# Patient Record
Sex: Female | Born: 1990 | Hispanic: Yes | Marital: Single | Smoking: Never smoker
Health system: Southern US, Community
[De-identification: ages and names within clinical notes are randomized; demographics above are authoritative.]

## PROBLEM LIST (undated history)

## (undated) ENCOUNTER — Ambulatory Visit: Admission: EM | Discharge status: Absent without leave | Payer: Self-pay

## (undated) DIAGNOSIS — R569 Unspecified convulsions: Secondary | ICD-10-CM

## (undated) DIAGNOSIS — M549 Dorsalgia, unspecified: Secondary | ICD-10-CM

## (undated) DIAGNOSIS — M419 Scoliosis, unspecified: Secondary | ICD-10-CM

## (undated) DIAGNOSIS — G8929 Other chronic pain: Secondary | ICD-10-CM

---
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
## 2009-01-12 ENCOUNTER — Observation Stay: Payer: Self-pay | Admitting: Obstetrics and Gynecology

## 2009-01-27 ENCOUNTER — Encounter: Payer: Self-pay | Admitting: Maternal & Fetal Medicine

## 2009-08-20 IMAGING — US US OB FOLLOW-UP - NRPT [facility]
1 series · 14 of 28 positions shown · non-contrast
Comparison: none

[Series 1: us ob follow-up - nrpt [facility] · 14 of 54 slices shown]
[im 2/54]
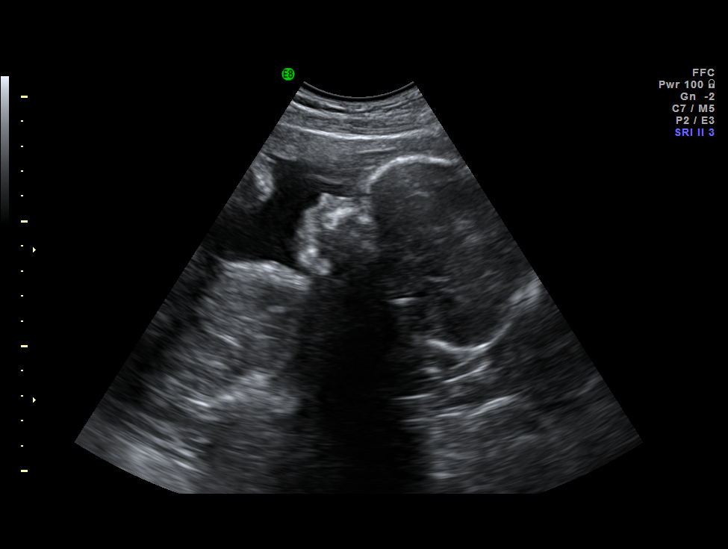
[im 6/54]
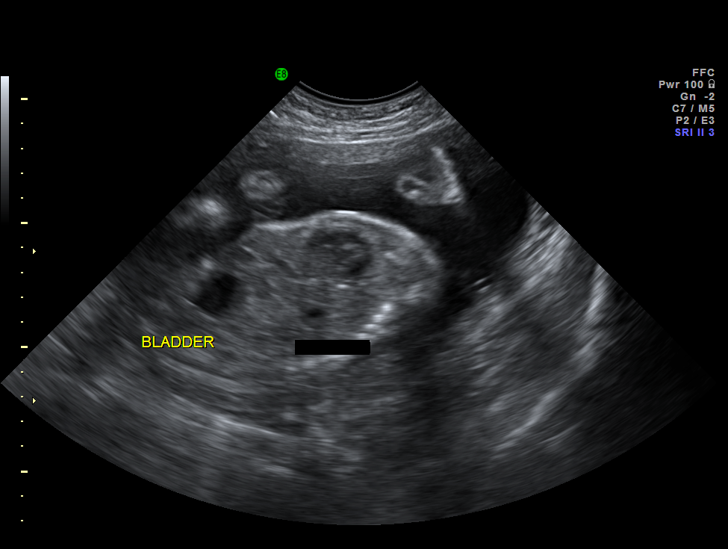
[im 10/54]
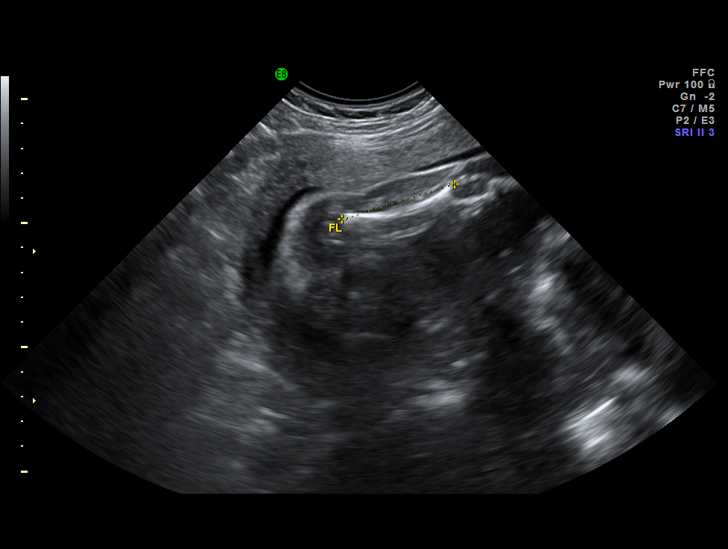
[im 14/54]
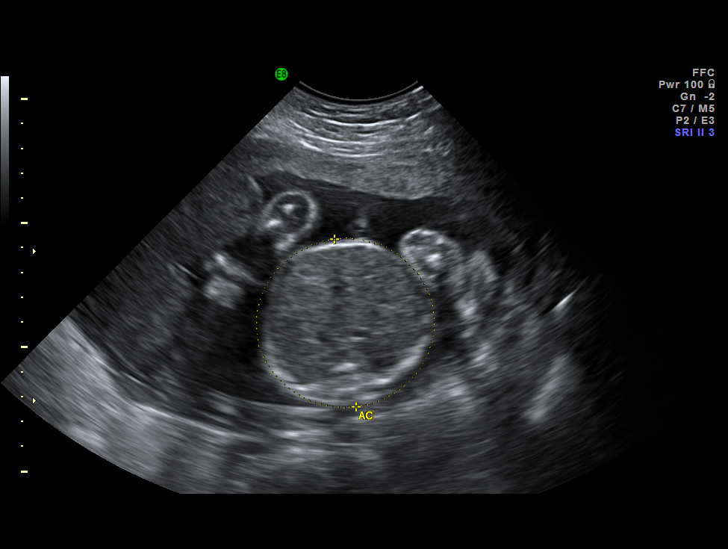
[im 18/54]
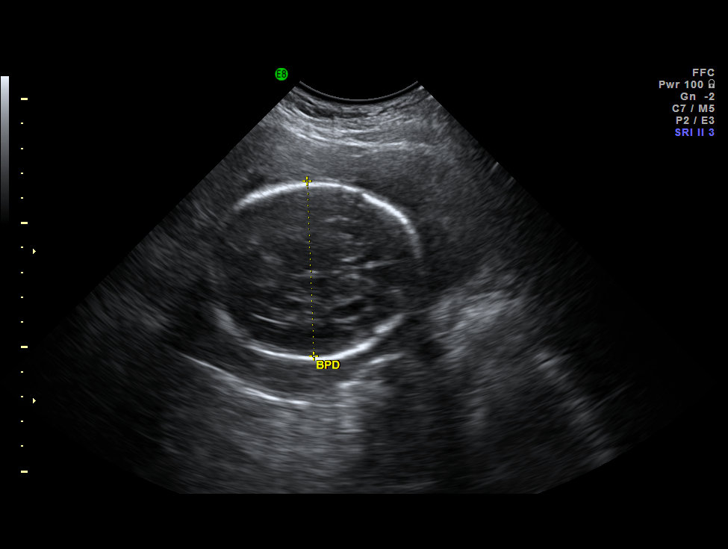
[im 22/54]
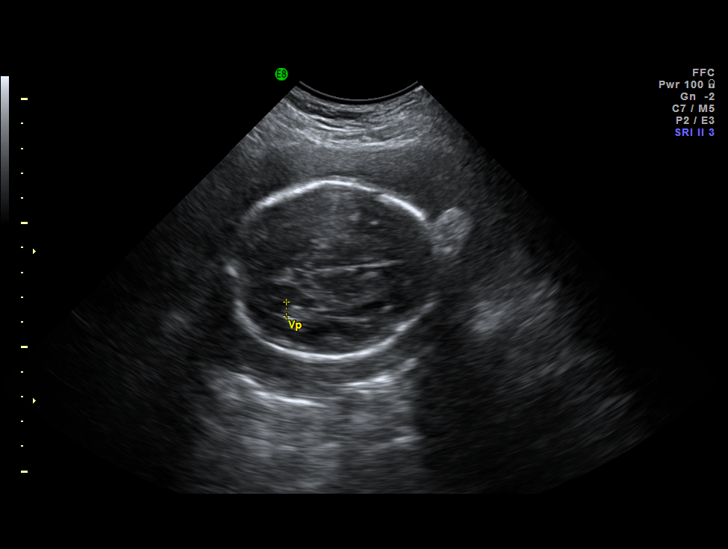
[im 26/54]
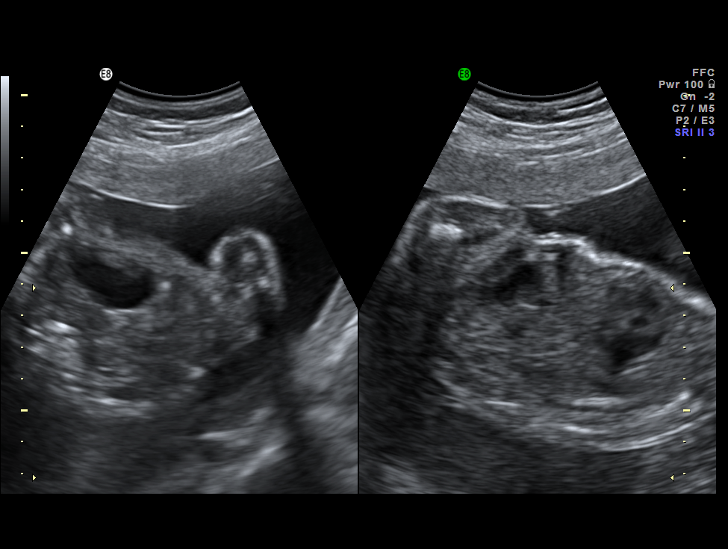
[im 30/54]
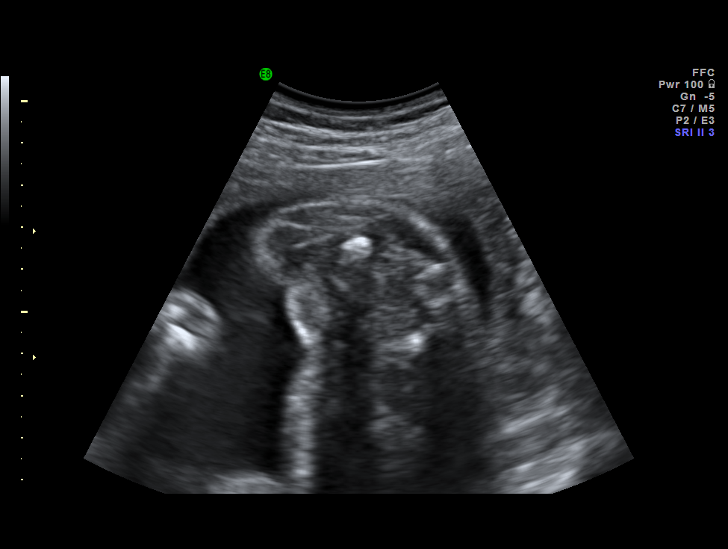
[im 34/54]
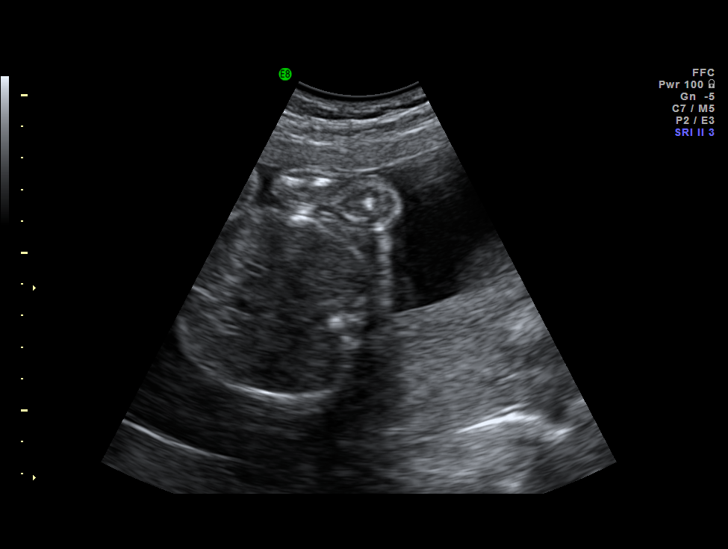
[im 38/54]
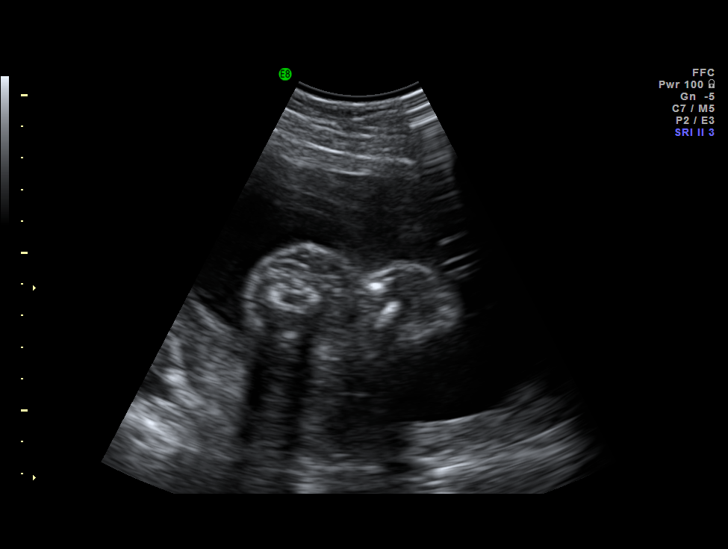
[im 42/54]
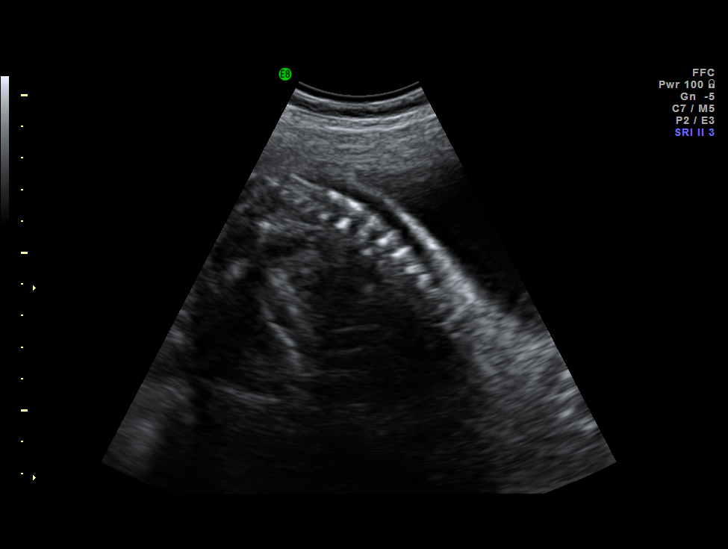
[im 46/54]
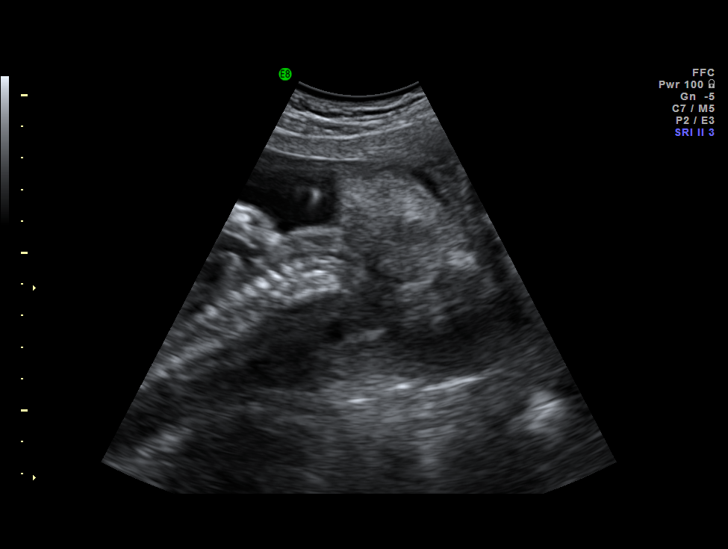
[im 50/54]
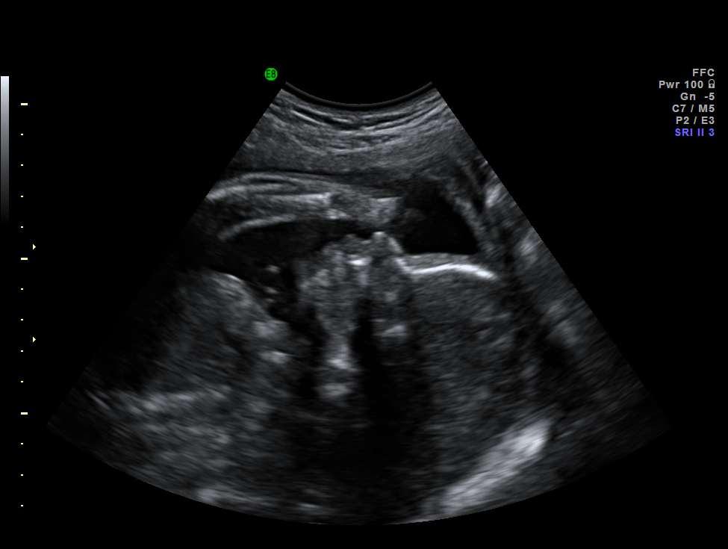
[im 54/54]
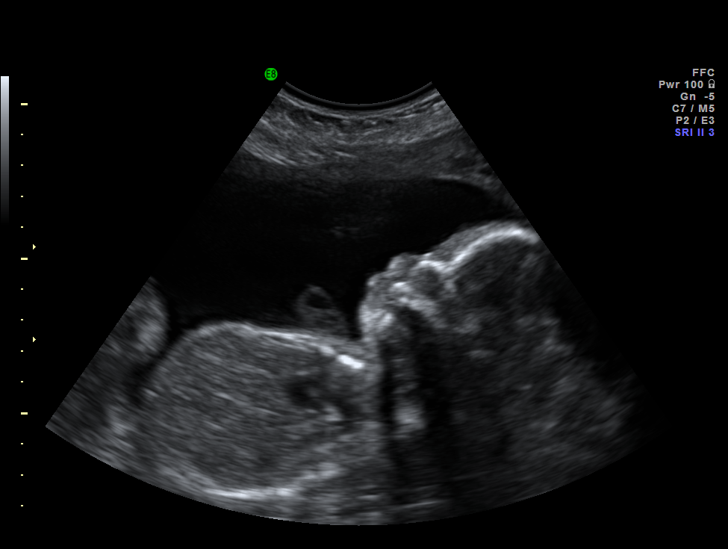

[14 of 28 positions shown; findings below may reference images not displayed]

IMAGES IMPORTED FROM THE [employer] WORKFLOW SYSTEM
NO DICTATION FOR STUDY

## 2009-10-09 ENCOUNTER — Observation Stay: Payer: Self-pay

## 2009-12-24 ENCOUNTER — Observation Stay: Payer: Self-pay | Admitting: Obstetrics and Gynecology

## 2011-02-21 ENCOUNTER — Ambulatory Visit: Payer: Self-pay | Admitting: Family Medicine

## 2014-07-23 ENCOUNTER — Emergency Department: Payer: Self-pay | Admitting: Emergency Medicine

## 2014-07-23 LAB — COMPREHENSIVE METABOLIC PANEL
ALT: 16 U/L
Albumin: 3.6 g/dL (ref 3.4–5.0)
Alkaline Phosphatase: 47 U/L
Anion Gap: 11 (ref 7–16)
BILIRUBIN TOTAL: 0.9 mg/dL (ref 0.2–1.0)
BUN: 12 mg/dL (ref 7–18)
CALCIUM: 8.4 mg/dL — AB (ref 8.5–10.1)
CO2: 24 mmol/L (ref 21–32)
Chloride: 105 mmol/L (ref 98–107)
Creatinine: 0.74 mg/dL (ref 0.60–1.30)
EGFR (African American): >60
Glucose: 96 mg/dL (ref 65–99)
OSMOLALITY: 279 (ref 275–301)
Potassium: 3.6 mmol/L (ref 3.5–5.1)
SGOT(AST): 26 U/L (ref 15–37)
SODIUM: 140 mmol/L (ref 136–145)
Total Protein: 7.4 g/dL (ref 6.4–8.2)

## 2014-07-23 LAB — DRUG SCREEN, URINE

## 2014-07-23 LAB — CBC
HCT: 39.7 % (ref 35.0–47.0)
HGB: 13.1 g/dL (ref 12.0–16.0)
MCH: 31.8 pg (ref 26.0–34.0)
MCHC: 33 g/dL (ref 32.0–36.0)
MCV: 96 fL (ref 80–100)
PLATELETS: 198 10*3/uL (ref 150–440)
RBC: 4.12 10*6/uL (ref 3.80–5.20)
RDW: 12.4 % (ref 11.5–14.5)
WBC: 6.1 10*3/uL (ref 3.6–11.0)

## 2014-07-23 LAB — ETHANOL: Ethanol: <3 mg/dL

## 2014-08-05 ENCOUNTER — Ambulatory Visit: Payer: Self-pay | Admitting: Neurology

## 2016-03-31 ENCOUNTER — Emergency Department
Admission: EM | Admit: 2016-03-31 | Discharge: 2016-03-31 | Disposition: A | Discharge status: Home / Self Care | Payer: Medicaid Other | Attending: Emergency Medicine | Admitting: Emergency Medicine

## 2016-03-31 ENCOUNTER — Encounter: Payer: Self-pay | Admitting: *Deleted

## 2016-03-31 DIAGNOSIS — R569 Unspecified convulsions: Secondary | ICD-10-CM

## 2016-03-31 DIAGNOSIS — G40909 Epilepsy, unspecified, not intractable, without status epilepticus: Secondary | ICD-10-CM | POA: Insufficient documentation

## 2016-03-31 HISTORY — DX: Unspecified convulsions: R56.9

## 2016-03-31 LAB — BASIC METABOLIC PANEL
ANION GAP: 9 (ref 5–15)
BUN: 10 mg/dL (ref 6–20)
CALCIUM: 8.6 mg/dL — AB (ref 8.9–10.3)
CO2: 24 mmol/L (ref 22–32)
CREATININE: 0.65 mg/dL (ref 0.44–1.00)
Chloride: 105 mmol/L (ref 101–111)
Glucose, Bld: 79 mg/dL (ref 65–99)
Potassium: 3.3 mmol/L — ABNORMAL LOW (ref 3.5–5.1)
SODIUM: 138 mmol/L (ref 135–145)

## 2016-03-31 LAB — POCT PREGNANCY, URINE: PREG TEST UR: NEGATIVE

## 2016-03-31 LAB — GLUCOSE, CAPILLARY: GLUCOSE-CAPILLARY: 81 mg/dL (ref 65–99)

## 2016-03-31 MED ORDER — LACOSAMIDE 50 MG PO TABS: 50.0000 mg | ORAL_TABLET | Freq: Two times a day (BID) | ORAL | 1 refills | Status: DC

## 2016-03-31 MED ORDER — LACOSAMIDE 50 MG PO TABS
50.0000 mg | ORAL_TABLET | Freq: Once | ORAL | Status: AC
  Administered 2016-03-31: 50 mg via ORAL

## 2016-03-31 NOTE — ED Provider Notes (Addendum)
[facility] Emergency Department Provider Note  ____________________________________________   I have reviewed the triage vital signs and the nursing notes.   HISTORY  Chief Complaint Seizures    HPI Sheila Hill is a 25 y.o. female who presents today complaining ofa seizure. Patient has a history of seizures. She was seen and had a negative MRI and was started on Lamictal and Keppra in 2016. However, she found herself getting depressed on these medications and took herself off. This morning she was in her normal state of health and had a grand mal witnessed seizure. Only one is reported. She has no recollection of the event. She did bite her lip. No other complaints of pain or discomfort.      Past Medical History:  Diagnosis Date  . Seizure (HCC)     There are no active problems to display for this patient.   History reviewed. No pertinent surgical history.  Prior to Admission medications   Not on File    Allergies Review of patient's allergies indicates no known allergies.  History reviewed. No pertinent family history.  Social History Social History  Substance Use Topics  . Smoking status: Never Smoker  . Smokeless tobacco: Never Used  . Alcohol use Not on file    Review of Systems Constitutional: No fever/chills Eyes: No visual changes. ENT: No sore throat. No stiff neck no neck pain Cardiovascular: Denies chest pain. Respiratory: Denies shortness of breath. Gastrointestinal:   no vomiting.  No diarrhea.  No constipation. Genitourinary: Negative for dysuria. Musculoskeletal: Negative lower extremity swelling Skin: Negative for rash. Neurological: Negative for severe headaches, focal weakness or numbness. 10-point ROS otherwise negative.  ____________________________________________   PHYSICAL EXAM:  VITAL SIGNS: ED Triage Vitals  Enc Vitals Group     BP 03/31/16 0853 119/72     Pulse Rate 03/31/16 0853 (!) 101      Resp 03/31/16 0853 18     Temp 03/31/16 0853 98.8 F (37.1 C)     Temp Source 03/31/16 0853 Oral     SpO2 03/31/16 0853 98 %     Weight 03/31/16 0859 125 lb (56.7 kg)     Height 03/31/16 0859 5\' 2"  (1.575 m)     Head Circumference --      Peak Flow --      Pain Score --      Pain Loc --      Pain Edu? --      Excl. in GC? --     Constitutional: Alert and oriented. Well appearing and in no acute distress. Eyes: Conjunctivae are normal. PERRL. EOMI. Head: Atraumatic. Nose: No congestion/rhinnorhea. Mouth/Throat: Mucous membranes are moist.  Oropharynx non-erythematous. Neck: No stridor.   Nontender with no meningismus Cardiovascular: Normal rate, regular rhythm. Grossly normal heart sounds.  Good peripheral circulation. Respiratory: Normal respiratory effort.  No retractions. Lungs CTAB. Abdominal: Soft and nontender. No distention. No guarding no rebound Back:  There is no focal tenderness or step off.  there is no midline tenderness there are no lesions noted. there is no CVA tenderness Musculoskeletal: No lower extremity tenderness, no upper extremity tenderness. No joint effusions, no DVT signs strong distal pulses no edema Neurologic:  Normal speech and language. No gross focal neurologic deficits are appreciated.  Skin:  Skin is warm, dry and intact. No rash noted. Psychiatric: Mood and affect are normal. Speech and behavior are normal.  ____________________________________________   LABS (all labs ordered are listed, but only abnormal results  are displayed)  Labs Reviewed  BASIC METABOLIC PANEL - Abnormal; Notable for the following:       Result Value   Potassium 3.3 (*)    Calcium 8.6 (*)    All other components within normal limits  GLUCOSE, CAPILLARY  CBG MONITORING, ED  POC URINE PREG, ED  POCT PREGNANCY, URINE   ____________________________________________  EKG  I personally interpreted any EKGs ordered by me or  triage  ____________________________________________  RADIOLOGY  I reviewed any imaging ordered by me or triage that were performed during my shift and, if possible, patient and/or family made aware of any abnormal findings. ____________________________________________   PROCEDURES  Procedure(s) performed: None  Procedures  Critical Care performed: None  ____________________________________________   INITIAL IMPRESSION / ASSESSMENT AND PLAN / ED COURSE  Pertinent labs & imaging results that were available during my care of the patient were reviewed by me and considered in my medical decision making (see chart for details).  Patient presents with a seizure. She was noncompliant with her medications before because they made her feel depressed. I have discussed with Dr. Thad Ranger. She suggested Vimpat 50 mg twice a day for this as a alternative. I have extensively advised the patient she cannot drive soak in the tub climb ladders etc. She does have a neurologist others she was lost to follow-up she is strongly advised to follow closely with Dr. Malvin Johns and will do so. Patient is otherwise well-appearing with no obvious evidence of a specific trigger event such as infection or pregnancy. Return precautions and follow-up given and understood.  ----------------------------------------- 1:40 PM on 03/31/2016 -----------------------------------------  We did discuss with the pharmacy who called patient cannot get Vimpat because of requirements for Medicare for prior authorization which is not possible from the emergency room on a Saturday. I will therefore start her on Keppra twice a day dosing hoping that that will be sufficient to forestall seizures but not cause any weight gain or other complaints at the patient had about it. She has been strongly advised follow closely with neurology and they may be able to either get her on Vimpat or try another regimen. Return precautions and follow-up  given and understood.   Clinical Course   ____________________________________________   FINAL CLINICAL IMPRESSION(S) / ED DIAGNOSES  Final diagnoses:  None      This chart was dictated using voice recognition software.  Despite best efforts to proofread,  errors can occur which can change meaning.      Jeanmarie Plant, MD 03/31/16 1009    Jeanmarie Plant, MD 03/31/16 1340

## 2016-03-31 NOTE — ED Notes (Signed)
Pt resting in bed, family at bedside, pt awake and alert in no acute distress 

## 2016-03-31 NOTE — ED Notes (Signed)
Pt instructed to not drive until follow up with neurology

## 2016-03-31 NOTE — ED Triage Notes (Signed)
Pt arrives via EMS from work after a witnessed grand mal seizure, pt arrives postictal, asking the same questions continually but able to state time and place, states she does not remember anything this AM, pt has bite mark to lip, states hx of seizure in the past

## 2017-03-30 ENCOUNTER — Ambulatory Visit
Admission: EM | Admit: 2017-03-30 | Discharge: 2017-03-30 | Disposition: A | Discharge status: Home / Self Care | Payer: Medicaid Other | Attending: Family Medicine | Admitting: Family Medicine

## 2017-03-30 ENCOUNTER — Encounter: Payer: Self-pay | Admitting: Gynecology

## 2017-03-30 DIAGNOSIS — Y9366 Activity, soccer: Secondary | ICD-10-CM | POA: Diagnosis not present

## 2017-03-30 DIAGNOSIS — S0012XA Contusion of left eyelid and periocular area, initial encounter: Secondary | ICD-10-CM | POA: Diagnosis not present

## 2017-03-30 DIAGNOSIS — W219XXA Striking against or struck by unspecified sports equipment, initial encounter: Secondary | ICD-10-CM | POA: Diagnosis not present

## 2017-03-30 DIAGNOSIS — H1132 Conjunctival hemorrhage, left eye: Secondary | ICD-10-CM

## 2017-03-30 HISTORY — DX: Other chronic pain: G89.29

## 2017-03-30 HISTORY — DX: Dorsalgia, unspecified: M54.9

## 2017-03-30 HISTORY — DX: Scoliosis, unspecified: M41.9

## 2017-03-30 NOTE — ED Triage Notes (Signed)
Per patient was playing soccer x 5 days ago when the soccer ball hit her in the left eye. Pt. With left eye redness and deny any pain.

## 2017-03-30 NOTE — ED Provider Notes (Signed)
[facility]    CSN: [id number] Arrival date & time: 03/30/17  0854     History   Chief Complaint Chief Complaint  Patient presents with  . Eye Problem    HPI Sheila Hill is a 26 y.o. female.   She is a 26 year old Hispanic female who was participating in a soccer activity. She states she was kicking soccer ball around with some friends when she was hit inadvertently in the left eye and left side of her face. She reports no stiff mild pain now but because the amount of bleeding in the left thigh swelling and bruising on the left side of her face she states her mother insisted she come in to be seen and evaluated. Past smoker history she has a history of seizure disorder scoliosis and some chronic back pain. Only surgeries from a C-section she's currently has Norplant as far as birth control. No known drug allergies and habits she does not smoke. No pertinent family medical history relevant to today's visit   The history is provided by the patient. No language interpreter was used.  Eye Problem  Location:  Left eye Severity:  Moderate Onset quality:  Sudden Duration:  4 days Timing:  Constant Progression:  Worsening Relieved by:  Nothing Worsened by:  Nothing Associated symptoms: redness     Past Medical History:  Diagnosis Date  . Chronic back pain   . Scoliosis   . Seizure (HCC)     There are no active problems to display for this patient.   Past Surgical History:  Procedure Laterality Date  . CESAREAN SECTION      OB History    No data available       Home Medications    Prior to Admission medications   Medication Sig Start Date End Date Taking? Authorizing Provider  divalproex (DEPAKOTE) 250 MG DR tablet Take 250 mg by mouth 3 (three) times daily.   Yes [provider]  etonogestrel (NEXPLANON) 68 MG IMPL implant 1 each by Subdermal route once.   Yes [provider]  lamoTRIgine (LAMICTAL) 150 MG tablet Take 150 mg by  mouth daily.   Yes [provider]  lacosamide (VIMPAT) 50 MG TABS tablet Take 1 tablet (50 mg total) by mouth 2 (two) times daily. 03/31/16   Jeanmarie Plant, MD    Family History No family history on file.  Social History Social History  Substance Use Topics  . Smoking status: Never Smoker  . Smokeless tobacco: Never Used  . Alcohol use No     Allergies   Patient has no known allergies.   Review of Systems Review of Systems  HENT: Positive for facial swelling.   Eyes: Positive for redness. Negative for visual disturbance.  Skin: Positive for color change.  All other systems reviewed and are negative.    Physical Exam Triage Vital Signs ED Triage Vitals  Enc Vitals Group     BP 03/30/17 0932 93/60     Pulse Rate 03/30/17 0932 78     Resp 03/30/17 0932 16     Temp 03/30/17 0932 99 F (37.2 C)     Temp Source 03/30/17 0932 Oral     SpO2 03/30/17 0932 99 %     Weight 03/30/17 0934 127 lb (57.6 kg)     Height 03/30/17 0934  (1.549 m)     Head Circumference --      Peak Flow --      Pain  Score 03/30/17 0935 0     Pain Loc --      Pain Edu? --      Excl. in GC? --    No data found.   Updated Vital Signs BP 93/60 (BP Location: Left Arm)   Pulse 78   Temp 99 F (37.2 C) (Oral)   Resp 16   Ht 5\' 1"  (1.549 m)   Wt 127 lb (57.6 kg)   LMP 03/25/2017   SpO2 99%   BMI 24.00 kg/m   Visual Acuity Right Eye Distance:   Left Eye Distance: 20/40 Bilateral Distance: 20/40 (without corrective lens)  Right Eye Near:   Left Eye Near:  L Near: 20/30 Bilateral Near:     Physical Exam  Constitutional: She is oriented to person, place, and time. She appears well-developed and well-nourished.  HENT:  Head: Normocephalic. Head is with left periorbital erythema.    Right Ear: Hearing, tympanic membrane, external ear and ear canal normal.  Left Ear: Hearing, tympanic membrane, external ear and ear canal normal.  Nose: Nose normal. Right sinus exhibits  no maxillary sinus tenderness and no frontal sinus tenderness. Left sinus exhibits no maxillary sinus tenderness and no frontal sinus tenderness.  Mouth/Throat: Uvula is midline and oropharynx is clear and moist. No uvula swelling.  Eyes: Pupils are equal, round, and reactive to light. Lids are normal. Lids are everted and swept, no foreign bodies found. Left conjunctiva has a hemorrhage. Right eye exhibits normal extraocular motion and no nystagmus. Left eye exhibits normal extraocular motion and no nystagmus. Right pupil is round and reactive. Left pupil is round and reactive. Pupils are equal.  Patient has a left subconjunctival hemorrhage there is swelling and blackened area around the left orbit as well but no signs of the subconjunctival hemorrhage going into or leaving the conjunctiva space. Good eye clear motion all around his bruising on the left orbit area consistent with a bruise that she received with soccer ball  Neck: Normal range of motion. Neck supple.  Pulmonary/Chest: Effort normal.  Musculoskeletal: Normal range of motion. She exhibits no edema or deformity.  Lymphadenopathy:    She has no cervical adenopathy.  Neurological: She is alert and oriented to person, place, and time.  Skin: Skin is warm.  Psychiatric: She has a normal mood and affect.  Vitals reviewed.    UC Treatments / Results  Labs (all labs ordered are listed, but only abnormal results are displayed) Labs Reviewed - No data to display  EKG  EKG Interpretation None       Radiology No results found.  Procedures Procedures (including critical care time)  Medications Ordered in UC Medications - No data to display   Initial Impression / Assessment and Plan / UC Course  I have reviewed the triage vital signs and the nursing notes.  Pertinent labs & imaging results that were available during my care of the patient were reviewed by me and considered in my medical decision making (see chart for  details).   patient reassured from this sports accident but no long-term problems present. Gave her note for her to return to work on Sunday since this is not a pinkeye but redness of the eye from the accident. Her visual acuity was not the best 20/40 and she is recommended follow-up with eye Dr. Gerlene Burdockichard choice for possible fitting of glasses   Final Clinical Impressions(s) / UC Diagnoses   Final diagnoses:  Subconjunctival hemorrhage of left eye  Black eye of  left side, initial encounter  Struck accidentally during sports, initial encounter    New Prescriptions Discharge Medication List as of 03/30/2017 10:14 AM      Note: This dictation was prepared with Dragon dictation along with smaller phrase technology. Any transcriptional errors that result from this process are unintentional. Controlled Substance Prescriptions [place] Controlled Substance Registry consulted? Not Applicable   Hassan Rowan, MD 03/30/17 1044

## 2017-07-12 ENCOUNTER — Emergency Department
Admission: EM | Admit: 2017-07-12 | Discharge: 2017-07-12 | Disposition: A | Discharge status: Home / Self Care | Payer: Medicaid Other | Attending: Emergency Medicine | Admitting: Emergency Medicine

## 2017-07-12 ENCOUNTER — Other Ambulatory Visit: Payer: Self-pay

## 2017-07-12 ENCOUNTER — Encounter: Payer: Self-pay | Admitting: Emergency Medicine

## 2017-07-12 DIAGNOSIS — Z79899 Other long term (current) drug therapy: Secondary | ICD-10-CM | POA: Diagnosis not present

## 2017-07-12 DIAGNOSIS — R569 Unspecified convulsions: Secondary | ICD-10-CM | POA: Insufficient documentation

## 2017-07-12 MED ORDER — DIVALPROEX SODIUM 250 MG PO DR TAB: 250.0000 mg | DELAYED_RELEASE_TABLET | Freq: Three times a day (TID) | ORAL | 0 refills | Status: DC

## 2017-07-12 MED ORDER — LAMOTRIGINE 150 MG PO TABS: 150.0000 mg | ORAL_TABLET | Freq: Two times a day (BID) | ORAL | 1 refills | Status: DC

## 2017-07-12 MED ORDER — LAMOTRIGINE 25 MG PO TABS
150.0000 mg | ORAL_TABLET | Freq: Once | ORAL | Status: AC
  Administered 2017-07-12: 150 mg via ORAL
  Filled 2017-07-12: qty 1

## 2017-07-12 NOTE — ED Notes (Signed)
Pt drowsy at this time, will wait for father to return before going over d/c instructions

## 2017-07-12 NOTE — ED Triage Notes (Signed)
Pt to  ED via [facility] from work for a grandmal seizure. Pt has hx/o same, pt is on 2 different seizure medications. Pt states that she missed a dose of her seizure medication yesterday, pt also states that she had increased caffeine consumption and less sleep, pt also under more stress than normal. Pt is A & O x 4. Pt in NAD at this time.

## 2017-07-12 NOTE — ED Provider Notes (Signed)
[facility] Emergency Department Provider Note   ____________________________________________    I have reviewed the triage vital signs and the nursing notes.   HISTORY  Chief Complaint Seizures     HPI Sheila Hill is a 26 y.o. female who presents after a reported generalized tonic-clonic seizure.  Patient reports she has a long history of seizures and takes valproic acid and lamotrigine however she recently ran out of lamotrigine and missed a dose yesterday and today which she believes is responsible for her seizure.  Her last seizure was in April, she reports she is generally well controlled and takes her seizure medication as directed.  She sees Dr. Malvin JohnsPotter of neurology.  She denies head injury or bony injury.  Feels well currently has no complaints.  Past Medical History:  Diagnosis Date  . Chronic back pain   . Scoliosis   . Seizure (HCC)     There are no active problems to display for this patient.   Past Surgical History:  Procedure Laterality Date  . CESAREAN SECTION      Prior to Admission medications   Medication Sig Start Date End Date Taking? Authorizing Provider  divalproex (DEPAKOTE) 250 MG DR tablet Take 1 tablet (250 mg total) by mouth 3 (three) times daily. 07/12/17 08/11/17  Jene EveryKinner, Korvin Valentine, MD  etonogestrel (NEXPLANON) 68 MG IMPL implant 1 each by Subdermal route once.    [provider]  lacosamide (VIMPAT) 50 MG TABS tablet Take 1 tablet (50 mg total) by mouth 2 (two) times daily. 03/31/16   Jeanmarie PlantMcShane, James A, MD  lamoTRIgine (LAMICTAL) 150 MG tablet Take 1 tablet (150 mg total) by mouth 2 (two) times daily. 07/12/17   Jene EveryKinner, Charlene Detter, MD     Allergies Patient has no known allergies.  No family history on file.  Social History Social History   Tobacco Use  . Smoking status: Never Smoker  . Smokeless tobacco: Never Used  Substance Use Topics  . Alcohol use: No  . Drug use: No    Review of  Systems  Constitutional: No fever/chills Eyes: No visual changes.  ENT: No sore throat. Cardiovascular: Denies chest pain. Respiratory: Denies shortness of breath. Gastrointestinal: No abdominal pain.  No nausea, no vomiting.   Genitourinary: Negative for dysuria. Musculoskeletal: Negative for back pain. Skin: Negative for rash. Neurological: Negative for headaches or weakness   ____________________________________________   PHYSICAL EXAM:  VITAL SIGNS: ED Triage Vitals [07/12/17 1228]  Enc Vitals Group     BP 104/74     Pulse Rate (!) 105     Resp 16     Temp 98.7 F (37.1 C)     Temp Source Oral     SpO2 97 %     Weight 54.4 kg (120 lb)     Height 1.524 m (5')     Head Circumference      Peak Flow      Pain Score      Pain Loc      Pain Edu?      Excl. in GC?     Constitutional: Alert and oriented. No acute distress. Pleasant and interactive Eyes: Conjunctivae are normal.  Head: Atraumatic. Nose: No congestion/rhinnorhea. Mouth/Throat: Mucous membranes are moist.   Neck:  Painless ROM Cardiovascular: Normal rate, regular rhythm.  Good peripheral circulation. Respiratory: Normal respiratory effort.  No retractions. Lungs CTAB. Gastrointestinal: Soft and nontender. No distention.  No CVA tenderness. Genitourinary: deferred Musculoskeletal:   Warm and well  perfused Neurologic:  Normal speech and language. No gross focal neurologic deficits are appreciated.  Skin:  Skin is warm, dry and intact. No rash noted. Psychiatric: Mood and affect are normal. Speech and behavior are normal.  ____________________________________________   LABS (all labs ordered are listed, but only abnormal results are displayed)  Labs Reviewed - No data to display ____________________________________________  EKG  None ____________________________________________  RADIOLOGY  None ____________________________________________   PROCEDURES  Procedure(s) performed:  No  Procedures   Critical Care performed: No ____________________________________________   INITIAL IMPRESSION / ASSESSMENT AND PLAN / ED COURSE  Pertinent labs & imaging results that were available during my care of the patient were reviewed by me and considered in my medical decision making (see chart for details).  Patient with a history of epilepsy, generally well controlled.  Did miss 2 doses of medication.  We will give her lamotrigine here that she missed and monitor for any other seizure activity.  Patient observed in the department for several hours, she remains well-appearing in no distress.  Will discharge with prescriptions for her seizure medication    ____________________________________________   FINAL CLINICAL IMPRESSION(S) / ED DIAGNOSES  Final diagnoses:  Seizure (HCC)        Note:  This document was prepared using Dragon voice recognition software and may include unintentional dictation errors.    Jene EveryKinner, Kentley Blyden, MD 07/12/17 1524

## 2017-09-10 ENCOUNTER — Emergency Department
Admission: EM | Admit: 2017-09-10 | Discharge: 2017-09-11 | Disposition: A | Discharge status: Other Acute Inpatient Hospital | Payer: Medicaid Other | Attending: Emergency Medicine | Admitting: Emergency Medicine

## 2017-09-10 ENCOUNTER — Other Ambulatory Visit: Payer: Self-pay

## 2017-09-10 DIAGNOSIS — F322 Major depressive disorder, single episode, severe without psychotic features: Secondary | ICD-10-CM

## 2017-09-10 DIAGNOSIS — T50902A Poisoning by unspecified drugs, medicaments and biological substances, intentional self-harm, initial encounter: Secondary | ICD-10-CM

## 2017-09-10 DIAGNOSIS — Z046 Encounter for general psychiatric examination, requested by authority: Secondary | ICD-10-CM | POA: Insufficient documentation

## 2017-09-10 DIAGNOSIS — Y939 Activity, unspecified: Secondary | ICD-10-CM | POA: Insufficient documentation

## 2017-09-10 DIAGNOSIS — T1491XA Suicide attempt, initial encounter: Secondary | ICD-10-CM | POA: Insufficient documentation

## 2017-09-10 DIAGNOSIS — Y929 Unspecified place or not applicable: Secondary | ICD-10-CM | POA: Insufficient documentation

## 2017-09-10 DIAGNOSIS — Z79899 Other long term (current) drug therapy: Secondary | ICD-10-CM | POA: Insufficient documentation

## 2017-09-10 DIAGNOSIS — X838XXA Intentional self-harm by other specified means, initial encounter: Secondary | ICD-10-CM | POA: Diagnosis not present

## 2017-09-10 DIAGNOSIS — T39392A Poisoning by other nonsteroidal anti-inflammatory drugs [NSAID], intentional self-harm, initial encounter: Secondary | ICD-10-CM | POA: Insufficient documentation

## 2017-09-10 DIAGNOSIS — Y999 Unspecified external cause status: Secondary | ICD-10-CM | POA: Insufficient documentation

## 2017-09-10 DIAGNOSIS — G40909 Epilepsy, unspecified, not intractable, without status epilepticus: Secondary | ICD-10-CM

## 2017-09-10 DIAGNOSIS — R45851 Suicidal ideations: Secondary | ICD-10-CM | POA: Insufficient documentation

## 2017-09-11 ENCOUNTER — Other Ambulatory Visit: Payer: Self-pay

## 2017-09-11 ENCOUNTER — Inpatient Hospital Stay
Admission: AD | Admit: 2017-09-11 | Discharge: 2017-09-13 | DRG: 885 | Disposition: A | Discharge status: Home / Self Care | Payer: Medicaid Other | Attending: Psychiatry | Admitting: Psychiatry

## 2017-09-11 DIAGNOSIS — Z79891 Long term (current) use of opiate analgesic: Secondary | ICD-10-CM

## 2017-09-11 DIAGNOSIS — Z793 Long term (current) use of hormonal contraceptives: Secondary | ICD-10-CM | POA: Diagnosis not present

## 2017-09-11 DIAGNOSIS — T1491XA Suicide attempt, initial encounter: Secondary | ICD-10-CM | POA: Diagnosis present

## 2017-09-11 DIAGNOSIS — F322 Major depressive disorder, single episode, severe without psychotic features: Secondary | ICD-10-CM

## 2017-09-11 DIAGNOSIS — M419 Scoliosis, unspecified: Secondary | ICD-10-CM | POA: Diagnosis present

## 2017-09-11 DIAGNOSIS — G40909 Epilepsy, unspecified, not intractable, without status epilepticus: Secondary | ICD-10-CM

## 2017-09-11 DIAGNOSIS — Z79899 Other long term (current) drug therapy: Secondary | ICD-10-CM | POA: Diagnosis not present

## 2017-09-11 DIAGNOSIS — G8929 Other chronic pain: Secondary | ICD-10-CM | POA: Diagnosis present

## 2017-09-11 DIAGNOSIS — Z915 Personal history of self-harm: Secondary | ICD-10-CM

## 2017-09-11 DIAGNOSIS — T39392A Poisoning by other nonsteroidal anti-inflammatory drugs [NSAID], intentional self-harm, initial encounter: Secondary | ICD-10-CM | POA: Diagnosis not present

## 2017-09-11 DIAGNOSIS — M549 Dorsalgia, unspecified: Secondary | ICD-10-CM | POA: Diagnosis present

## 2017-09-11 DIAGNOSIS — F1010 Alcohol abuse, uncomplicated: Secondary | ICD-10-CM | POA: Diagnosis present

## 2017-09-11 LAB — CBC
HCT: 38.6 % (ref 35.0–47.0)
HEMOGLOBIN: 13.3 g/dL (ref 12.0–16.0)
MCH: 33.9 pg (ref 26.0–34.0)
MCHC: 34.5 g/dL (ref 32.0–36.0)
MCV: 98.3 fL (ref 80.0–100.0)
PLATELETS: 180 10*3/uL (ref 150–440)
RBC: 3.93 MIL/uL (ref 3.80–5.20)
RDW: 12.8 % (ref 11.5–14.5)
WBC: 6.7 10*3/uL (ref 3.6–11.0)

## 2017-09-11 LAB — URINE DRUG SCREEN, QUALITATIVE (ARMC ONLY)
AMPHETAMINES, UR SCREEN: NOT DETECTED
Barbiturates, Ur Screen: NOT DETECTED
Benzodiazepine, Ur Scrn: NOT DETECTED
COCAINE METABOLITE, UR ~~LOC~~: NOT DETECTED
Cannabinoid 50 Ng, Ur ~~LOC~~: NOT DETECTED
MDMA (ECSTASY) UR SCREEN: NOT DETECTED
METHADONE SCREEN, URINE: NOT DETECTED
Opiate, Ur Screen: NOT DETECTED
Phencyclidine (PCP) Ur S: NOT DETECTED
TRICYCLIC, UR SCREEN: NOT DETECTED

## 2017-09-11 LAB — COMPREHENSIVE METABOLIC PANEL
ALBUMIN: 3.9 g/dL (ref 3.5–5.0)
ALK PHOS: 42 U/L (ref 38–126)
ALT: 10 U/L — ABNORMAL LOW (ref 14–54)
ANION GAP: 8 (ref 5–15)
AST: 20 U/L (ref 15–41)
BILIRUBIN TOTAL: 0.5 mg/dL (ref 0.3–1.2)
BUN: 12 mg/dL (ref 6–20)
CALCIUM: 8.5 mg/dL — AB (ref 8.9–10.3)
CO2: 28 mmol/L (ref 22–32)
Chloride: 104 mmol/L (ref 101–111)
Creatinine, Ser: 0.85 mg/dL (ref 0.44–1.00)
GFR calc non Af Amer: >60 mL/min (ref >60)
GLUCOSE: 89 mg/dL (ref 65–99)
Potassium: 3.7 mmol/L (ref 3.5–5.1)
SODIUM: 140 mmol/L (ref 135–145)
TOTAL PROTEIN: 7.5 g/dL (ref 6.5–8.1)

## 2017-09-11 LAB — SALICYLATE LEVEL: Salicylate Lvl: <7 mg/dL (ref 2.8–30.0)

## 2017-09-11 LAB — ACETAMINOPHEN LEVEL: Acetaminophen (Tylenol), Serum: <10 ug/mL — ABNORMAL LOW (ref 10–30)

## 2017-09-11 LAB — ETHANOL: Alcohol, Ethyl (B): <10 mg/dL (ref <10)

## 2017-09-11 MED ORDER — ACETAMINOPHEN 325 MG PO TABS: 650.0000 mg | ORAL_TABLET | Freq: Four times a day (QID) | ORAL | Status: DC | PRN

## 2017-09-11 MED ORDER — LAMOTRIGINE 100 MG PO TABS
150.0000 mg | ORAL_TABLET | Freq: Two times a day (BID) | ORAL | Status: DC
  Administered 2017-09-11 (×2): 150 mg via ORAL
  Filled 2017-09-11 (×2): qty 1
  Filled 2017-09-11: qty 2

## 2017-09-11 MED ORDER — DIVALPROEX SODIUM 250 MG PO DR TAB
250.0000 mg | DELAYED_RELEASE_TABLET | Freq: Three times a day (TID) | ORAL | Status: DC
  Administered 2017-09-12 (×2): 250 mg via ORAL
  Filled 2017-09-11 (×2): qty 1

## 2017-09-11 MED ORDER — TRAZODONE HCL 100 MG PO TABS
100.0000 mg | ORAL_TABLET | Freq: Every evening | ORAL | Status: DC | PRN
  Administered 2017-09-12: 100 mg via ORAL
  Filled 2017-09-11: qty 1

## 2017-09-11 MED ORDER — ALUM & MAG HYDROXIDE-SIMETH 200-200-20 MG/5ML PO SUSP: 30.0000 mL | ORAL | Status: DC | PRN

## 2017-09-11 MED ORDER — DIVALPROEX SODIUM 250 MG PO DR TAB
250.0000 mg | DELAYED_RELEASE_TABLET | Freq: Three times a day (TID) | ORAL | Status: DC
  Administered 2017-09-11 (×3): 250 mg via ORAL
  Filled 2017-09-11 (×3): qty 1

## 2017-09-11 MED ORDER — MIRTAZAPINE 15 MG PO TABS
15.0000 mg | ORAL_TABLET | Freq: Every day | ORAL | Status: DC
  Administered 2017-09-12: 15 mg via ORAL
  Filled 2017-09-11: qty 1

## 2017-09-11 MED ORDER — MAGNESIUM HYDROXIDE 400 MG/5ML PO SUSP: 30.0000 mL | Freq: Every day | ORAL | Status: DC | PRN

## 2017-09-11 MED ORDER — MIRTAZAPINE 15 MG PO TABS
15.0000 mg | ORAL_TABLET | Freq: Every day | ORAL | Status: DC
  Administered 2017-09-11: 15 mg via ORAL
  Filled 2017-09-11: qty 1

## 2017-09-11 MED ORDER — CHARCOAL ACTIVATED PO LIQD
50.0000 g | Freq: Once | ORAL | Status: AC
  Administered 2017-09-11: 50 g via ORAL

## 2017-09-11 MED ORDER — LAMOTRIGINE 25 MG PO TABS
150.0000 mg | ORAL_TABLET | Freq: Two times a day (BID) | ORAL | Status: DC
  Administered 2017-09-12 – 2017-09-13 (×3): 150 mg via ORAL
  Filled 2017-09-11 (×4): qty 1

## 2017-09-11 MED ORDER — SODIUM CHLORIDE 0.9 % IV SOLN
Freq: Once | INTRAVENOUS | Status: AC
  Administered 2017-09-11: 05:00:00 via INTRAVENOUS

## 2017-09-11 MED ORDER — HYDROXYZINE HCL 25 MG PO TABS: 25.0000 mg | ORAL_TABLET | Freq: Three times a day (TID) | ORAL | Status: DC | PRN

## 2017-09-11 NOTE — BH Assessment (Signed)
Assessment Note  Sheila Hill is an 27 y.o. female who presents to the ED for an apparent medication overdose in an attempt to end her life. Per Dr. Manson Passey "Patient states that she took a handful of Toradol 10 mg tablets in an unknown time.  Based on calculation patient may have taken 13 tablets.  Patient states she did so to "kill herself".  Patient denies any previous suicide attempts." During the assessment, the patient was not forthcoming about why she took the medication. She stated, "I'm just tired of everything. I've had a lot of stress for a long time and just felt like I couldn't handle anymore." She did admit however, that she was trying to hurt herself by taking the pills. She states that she has been feeling somewhat depressed lately with feelings of self-pity and "not being where I should be in life".   Pt reports that she has three children and lives at home with her parents. Pt does not mention having a boyfriend or a breakup as the trigger for her suicide attempt. She denies any drug and/or alcohol use. Pt denies being currently suicidal, homicidal, or having any A/V H.  Diagnosis: Suicidal Ideation   Past Medical History:  Past Medical History:  Diagnosis Date  . Chronic back pain   . Scoliosis   . Seizure [facility])     Past Surgical History:  Procedure Laterality Date  . CESAREAN SECTION      Family History: No family history on file.  Social History:  reports that  has never smoked. she has never used smokeless tobacco. She reports that she does not drink alcohol or use drugs.  Additional Social History:  Alcohol / Drug Use Pain Medications: See MAR Prescriptions: See MAR Over the Counter: See MAR History of alcohol / drug use?: No history of alcohol / drug abuse(Pt denies)  CIWA: CIWA-Ar BP: (!) 87/42 Pulse Rate: 83 COWS:    Allergies: No Known Allergies  Home Medications:  (Not in a hospital admission)  OB/GYN Status:  No LMP recorded. Patient has had an  implant.  General Assessment Data Location of Assessment: [facility] ED TTS Assessment: In system Is this a Tele or Face-to-Face Assessment?: Face-to-Face Is this an Initial Assessment or a Re-assessment for this encounter?: Initial Assessment Marital status: Single Maiden name: n/a Is patient pregnant?: No Pregnancy Status: No Living Arrangements: Parent Can pt return to current living arrangement?: Yes Admission Status: Involuntary Is patient capable of signing voluntary admission?: No Referral Source: Self/Family/Friend Insurance type: Medicaid  Medical Screening Exam [facility] Walk-in ONLY) Medical Exam completed: Yes  Crisis Care Plan Living Arrangements: Parent Legal Guardian: Other:(Self) Name of Psychiatrist: none reported Name of Therapist: none reported  Education Status Is patient currently in school?: No Highest grade of school patient has completed: Some College  Risk to self with the past 6 months Suicidal Ideation: No-Not Currently/Within Last 6 Months Has patient been a risk to self within the past 6 months prior to admission? : Yes Suicidal Intent: No-Not Currently/Within Last 6 Months Has patient had any suicidal intent within the past 6 months prior to admission? : Yes Is patient at risk for suicide?: Yes Suicidal Plan?: Yes-Currently Present Has patient had any suicidal plan within the past 6 months prior to admission? : Yes Specify Current Suicidal Plan: overdose by medication Access to Means: Yes Specify Access to Suicidal Means: home medications What has been your use of drugs/alcohol within the last 12 months?: pt denies Previous Attempts/Gestures:  No How many times?: 0 Other Self Harm Risks: pt denies Triggers for Past Attempts: None known Intentional Self Injurious Behavior: None Family Suicide History: Unknown Recent stressful life event(s): Loss (Comment)(relationship breakup) Persecutory voices/beliefs?: No Depression: Yes Depression Symptoms:  Feeling worthless/self pity Substance abuse history and/or treatment for substance abuse?: No Suicide prevention information given to non-admitted patients: Not applicable  Risk to Others within the past 6 months Homicidal Ideation: No Does patient have any lifetime risk of violence toward others beyond the six months prior to admission? : No Thoughts of Harm to Others: No Current Homicidal Intent: No Current Homicidal Plan: No Access to Homicidal Means: No Identified Victim: none noted History of harm to others?: No Assessment of Violence: None Noted Violent Behavior Description: pt denies violent behaviors Does patient have access to weapons?: No Criminal Charges Pending?: No Does patient have a court date: No Is patient on probation?: No  Psychosis Hallucinations: None noted Delusions: None noted  Mental Status Report Appearance/Hygiene: In scrubs Eye Contact: Good Motor Activity: Freedom of movement Speech: Soft, Slow, Logical/coherent Level of Consciousness: Drowsy Mood: Sad Affect: Sad Anxiety Level: None Thought Processes: Coherent Judgement: Partial Orientation: Person, Place, Time Obsessive Compulsive Thoughts/Behaviors: None  Cognitive Functioning Concentration: Normal Memory: Recent Intact, Remote Intact IQ: Average Insight: Poor Impulse Control: Poor Appetite: Poor Weight Loss: 5 Weight Gain: 0 Sleep: Decreased Total Hours of Sleep: 6 Vegetative Symptoms: None     Prior Inpatient Therapy Prior Inpatient Therapy: No Prior Therapy Dates: n/a Prior Therapy Facilty/Provider(s): n/a Reason for Treatment: n/a  Prior Outpatient Therapy Prior Outpatient Therapy: No Prior Therapy Dates: n/a Prior Therapy Facilty/Provider(s): n/a Reason for Treatment: n/a Does patient have an ACCT team?: No Does patient have Intensive In-House Services?  : No Does patient have [facility] services? : No Does patient have P4CC services?: No  ADL Screening (condition  at time of admission) Is the patient deaf or have difficulty hearing?: No Does the patient have difficulty seeing, even when wearing glasses/contacts?: No Does the patient have difficulty concentrating, remembering, or making decisions?: No Does the patient have difficulty dressing or bathing?: No Does the patient have difficulty walking or climbing stairs?: No Weakness of Legs: None Weakness of Arms/Hands: None  Home Assistive Devices/Equipment Home Assistive Devices/Equipment: None  Therapy Consults (therapy consults require a physician order) PT Evaluation Needed: No OT Evalulation Needed: No SLP Evaluation Needed: No Abuse/Neglect Assessment (Assessment to be complete while patient is alone) Abuse/Neglect Assessment Can Be Completed: Yes Physical Abuse: Denies Verbal Abuse: Denies Sexual Abuse: Denies Exploitation of patient/patient's resources: Denies Self-Neglect: Denies Values / Beliefs Cultural Requests During Hospitalization: None Spiritual Requests During Hospitalization: None Consults Spiritual Care Consult Needed: No Social Work Consult Needed: No Merchant navy officerAdvance Directives (For Healthcare) Does Patient Have a Medical Advance Directive?: No Would patient like information on creating a medical advance directive?: No - Patient declined    Additional Information 1:1 In Past 12 Months?: No CIRT Risk: No Elopement Risk: No  Child/Adolescent Assessment Running Away Risk: Denies Bed-Wetting: Denies Destruction of Property: Denies Cruelty to Animals: Denies Stealing: Denies Rebellious/Defies Authority: Denies Satanic Involvement: Denies Archivistire Setting: Denies Problems at Progress EnergySchool: Denies Gang Involvement: Denies  Disposition:  Disposition Initial Assessment Completed for this Encounter: Yes Disposition of Patient: Pending Review with psychiatrist  On Site Evaluation by:   Reviewed with Physician:    Coulter Oldaker D Keiland Pickering 09/11/2017 5:52 AM

## 2017-09-11 NOTE — ED Notes (Signed)
BEHAVIORAL HEALTH ROUNDING Patient sleeping: No. Patient alert and oriented: yes Behavior appropriate: Yes.  ; If no, describe:  Nutrition and fluids offered: yes Toileting and hygiene offered: Yes  Sitter present: q15 minute observations and security  monitoring Law enforcement present: Yes  ODS  

## 2017-09-11 NOTE — ED Provider Notes (Signed)
Vitals:   09/11/17 1000 09/11/17 1100  BP: 98/66 102/68  Pulse: 79 87  Resp: (!) 21 14  Temp:    SpO2: 99% 100%   I accepted care this morning at shift exchange.  No acute events reported to me overnight by nursing report or physician report.  Patient under IVC, medically cleared.  Plan for psychiatric disposition/admission.    Sheila Hill, Sheila Kocsis, MD 09/11/17 [phone]

## 2017-09-11 NOTE — ED Notes (Signed)
Explained to pt the process of IVC and overdose monitoring. Pt verbalizes understanding. Pt denies feelings of wanting to die at this time.

## 2017-09-11 NOTE — ED Notes (Signed)
Pt states she is not actively suicidal. Pt states " I just feel heavy and relaxed" Pt dresses out by Gregor Hamsebecca RN and Educational psychologistLorrie RN. Pt placed in burgundy scrubs, jewelry removed and placed in a specimen cup. Clothing and jewelry placed in a pt belonging bag with name label placed on bag.

## 2017-09-11 NOTE — ED Provider Notes (Signed)
[facility] Emergency Department Provider Note    First MD Initiated Contact with Patient 09/10/17 2353     (approximate)  I have reviewed the triage vital signs and the nursing notes.   HISTORY  Chief Complaint Suicidal and Ingestion    HPI Sheila Hill is a 27 y.o. female presents to the emergency department status post intentional medication overdose suicide attempt.  Patient states that she took a handful of Toradol 10 mg tablets in an unknown time.  Based on calculation patient may have taken 13 tablets.  Patient states she did so to "kill herself".  Patient denies any previous suicide attempts.      Past Medical History:  Diagnosis Date  . Chronic back pain   . Scoliosis   . Seizure (HCC)     There are no active problems to display for this patient.   Past Surgical History:  Procedure Laterality Date  . CESAREAN SECTION      Prior to Admission medications   Medication Sig Start Date End Date Taking? Authorizing Provider  divalproex (DEPAKOTE) 250 MG DR tablet Take 1 tablet (250 mg total) by mouth 3 (three) times daily. 07/12/17 08/11/17  Jene EveryKinner, Robert, MD  etonogestrel (NEXPLANON) 68 MG IMPL implant 1 each by Subdermal route once.    [provider]  lacosamide (VIMPAT) 50 MG TABS tablet Take 1 tablet (50 mg total) by mouth 2 (two) times daily. 03/31/16   Jeanmarie PlantMcShane, James A, MD  lamoTRIgine (LAMICTAL) 150 MG tablet Take 1 tablet (150 mg total) by mouth 2 (two) times daily. 07/12/17   Jene EveryKinner, Robert, MD    Allergies No known drug allergies No family history on file.  Social History Social History   Tobacco Use  . Smoking status: Never Smoker  . Smokeless tobacco: Never Used  Substance Use Topics  . Alcohol use: No  . Drug use: No    Review of Systems Constitutional: No fever/chills Eyes: No visual changes. ENT: No sore throat. Cardiovascular: Denies chest pain. Respiratory: Denies shortness of  breath. Gastrointestinal: No abdominal pain.  No nausea, no vomiting.  No diarrhea.  No constipation. Genitourinary: Negative for dysuria. Musculoskeletal: Negative for neck pain.  Negative for back pain. Integumentary: Negative for rash. Neurological: Negative for headaches, focal weakness or numbness. Psychiatry: Positive for medication overdose suicide attempt  ____________________________________________   PHYSICAL EXAM:  VITAL SIGNS: ED Triage Vitals [09/10/17 2356]  Enc Vitals Group     BP 116/63     Pulse Rate (!) 107     Resp 17     Temp 98.2 F (36.8 C)     Temp Source Oral     SpO2 100 %     Weight      Height      Head Circumference      Peak Flow      Pain Score      Pain Loc      Pain Edu?      Excl. in GC?     Constitutional: Alert and oriented.  Tearful  eyes: Conjunctivae are normal. PERRL. EOMI. Head: Atraumatic. Mouth/Throat: Mucous membranes are moist. Oropharynx non-erythematous. Neck: No stridor.   Cardiovascular: Normal rate, regular rhythm. Good peripheral circulation. Grossly normal heart sounds. Respiratory: Normal respiratory effort.  No retractions. Lungs CTAB. Gastrointestinal: Soft and nontender. No distention.  Musculoskeletal: No lower extremity tenderness nor edema. No gross deformities of extremities. Neurologic:  Normal speech and language. No gross focal neurologic deficits are  appreciated.  Skin:  Skin is warm, dry and intact. No rash noted. Psychiatric: Mood and affect are normal. Speech and behavior are normal.  ____________________________________________   LABS (all labs ordered are listed, but only abnormal results are displayed)  Labs Reviewed  COMPREHENSIVE METABOLIC PANEL  ETHANOL  CBC  URINE DRUG SCREEN, QUALITATIVE ([facility] ONLY)  POC URINE PREG, ED   ____________________________________________  EKG  ED ECG REPORT I, [place] BROWN, the attending physician, personally viewed and interpreted this ECG.    Date: 09/11/2017  EKG Time: 11:58 PM  Rate: 106  Rhythm: Sinus tachycardia  Axis: Normal  Intervals: Normal  ST&T Change: None    Procedures   ____________________________________________   INITIAL IMPRESSION / ASSESSMENT AND PLAN / ED COURSE  As part of my medical decision making, I reviewed the following data within the electronic MEDICAL RECORD 44 year old female presenting with above-stated history and physical exam secondary to intentional medication overdose suicide attempt patient involuntarily committed by law enforcement.  Laboratory data unremarkable including repeat 4-hour Tylenol level.  Patient medically cleared at this time.  Await psychiatry consultation ____________________________________________  FINAL CLINICAL IMPRESSION(S) / ED DIAGNOSES  Final diagnoses:  Medication overdose, intentional self-harm, initial encounter (HCC)  Suicide attempt (HCC)     MEDICATIONS GIVEN DURING THIS VISIT:  Medications  charcoal activated (NO SORBITOL) (ACTIDOSE-AQUA) suspension 50 g (50 g Oral Given 09/11/17 0006)     ED Discharge Orders    None       Note:  This document was prepared using Dragon voice recognition software and may include unintentional dictation errors.    Darci Current, MD 09/11/17 [phone]

## 2017-09-11 NOTE — ED Notes (Signed)
Pt tearful when assessed by RN. Pt stated this unit is giving her anxiety. Currently denies SI. Maintained on 15 minute checks and observation by security camera for safety.

## 2017-09-11 NOTE — ED Triage Notes (Addendum)
Patient came in EMS for intentional overdose of 13 10mg  tablets of Ketoralac because boyfriend broke up with her. She texted her friend and said she wanted to end it all. Hx of seizures. PD taking out papers for IVC.

## 2017-09-11 NOTE — ED Notes (Signed)
BEHAVIORAL HEALTH ROUNDING Patient sleeping: Yes.   Patient alert and oriented: eyes closed  Appears to be asleep Behavior appropriate: Yes.  ; If no, describe:  Nutrition and fluids offered: Yes  Toileting and hygiene offered: sleeping Sitter present: q 15 minute observations and security monitoring Law enforcement present: yes  ODS 

## 2017-09-11 NOTE — ED Provider Notes (Signed)
Discussed with Dr. Toni Amendlapacs face-to-face, plan for [facility].   Governor RooksLord, Alima Naser, MD 09/11/17 [phone]

## 2017-09-11 NOTE — ED Notes (Signed)
Report given to RN in BMU. [facility]. Patient in agreement with plan of care.

## 2017-09-11 NOTE — ED Notes (Signed)
Patient is resting comfortably. 

## 2017-09-11 NOTE — ED Notes (Signed)
[facility] Verbal report given to receiving nurse Amy B, RN. Pt A & O X3 at this time. Ambulatory with a steady gait. Appears to be in no physical distress at time of transfer.

## 2017-09-11 NOTE — Plan of Care (Signed)
  Not Progressing Pain Managment: General experience of comfort will improve 09/11/2017 2349 - Not Progressing by Galen ManilaVigil, Treyshaun Keatts E, RN Education: Utilization of techniques to improve thought processes will improve 09/11/2017 2349 - Not Progressing by Galen ManilaVigil, Eleah Lahaie E, RN Knowledge of the prescribed therapeutic regimen will improve 09/11/2017 2349 - Not Progressing by Galen ManilaVigil, Marke Goodwyn E, RN Coping: Ability to cope will improve 09/11/2017 2349 - Not Progressing by Galen ManilaVigil, Cailynn Bodnar E, RN Ability to verbalize feelings will improve 09/11/2017 2349 - Not Progressing by Galen ManilaVigil, Hanin Decook E, RN Health Behavior/Discharge Planning: Ability to make decisions will improve 09/11/2017 2349 - Not Progressing by Galen ManilaVigil, Momina Hunton E, RN Compliance with therapeutic regimen will improve 09/11/2017 2349 - Not Progressing by Galen ManilaVigil, Aurea Aronov E, RN Safety: Ability to disclose and discuss suicidal ideas will improve 09/11/2017 2349 - Not Progressing by Galen ManilaVigil, Hilma Steinhilber E, RN Ability to identify and utilize support systems that promote safety will improve 09/11/2017 2349 - Not Progressing by Galen ManilaVigil, Tanishka Drolet E, RN Self-Concept: Ability to verbalize positive feelings about self will improve 09/11/2017 2349 - Not Progressing by Galen ManilaVigil, Amaya Blakeman E, RN Level of anxiety will decrease 09/11/2017 2349 - Not Progressing by Galen ManilaVigil, Zyla Dascenzo E, RN Health Behavior/Discharge Planning: Identification of resources available to assist in meeting health care needs will improve 09/11/2017 2349 - Not Progressing by Galen ManilaVigil, Kimimila Tauzin E, RN Medication: Compliance with prescribed medication regimen will improve 09/11/2017 2349 - Not Progressing by Galen ManilaVigil, Mozes Sagar E, RN

## 2017-09-11 NOTE — ED Notes (Addendum)
father is currently visiting with her  - she is tearful  Patient provided her parent with an update

## 2017-09-11 NOTE — ED Notes (Signed)
Pt IVC pending placement to [facility]. 

## 2017-09-11 NOTE — ED Notes (Signed)
Called lab to verify blood is being run for missing results. Lab states they are in process.

## 2017-09-11 NOTE — Progress Notes (Addendum)
27 year old hispanic female admitted IVC after attempting suicide by taking approximately 13 10mg  Toradol. Patient reports she had an ingrown toenail and was prescribed Toradol for pain. Upon admission, patient denies suicidal ideation. Reports she became overwhelmed by personal issues and took the pills impulsively. Reports she would not do it again. Agrees to contract for safety. Denies HI and AVH. Reports no psychiatric history. Denies family history of psychiatric issues. Medical history +epilepsy. Reports currently taking Lamotrigine and Depakote for Seizure DO. Last seizure in December 2018. Patient reports she believes that she had a seizure despite being compliant with medications because she was over-tired. Patient has recently broken up with husband, lives with her parents, and has 3 children ages 646, 607, and 1088. Patient reports she sometimes has difficulty getting an adequate amount of sleep. 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 Patient reports she does not make enough money to afford health insurance to continue with her seizure medications which she says are very expensive without Medicaid. This is an additional stressor for her. Skin assessment completed. No contraband found. Patient is compliant with admission procedure. Patient is tearful at times but speech is clear and logical. Mood and affect are congruent. Motor activity is WNL. Denies use of tobacco, ETOH, and drugs. UDS negative. Patient is currently on her menses and has an implant for birth control. Oriented to unit. Belongings searched and wanded. No contraband found. Q 15 minute checks initiated. Will continue to monitor throughout the shift. Patient slept 6 hours. No apparent distress. Patient BP low. Provided fluids and educated. Patient verbalized understanding. EKG completed. Urine pregnancy collected. Will endorse care to  oncoming shift.

## 2017-09-11 NOTE — ED Notes (Signed)
Pt. Alert and oriented, warm and dry, in no distress. Pt. Denies SI, HI, and AVH. Pt. Encouraged to let nursing staff know of any concerns or needs. 

## 2017-09-11 NOTE — ED Notes (Signed)
Mom and Dad here requesting to see pt.  Rules of visitation explained by this RN.  Parents would like daughter to call them when she is able this am: dad [phone]; mom [phone].

## 2017-09-11 NOTE — ED Notes (Signed)

## 2017-09-11 NOTE — ED Notes (Signed)
Pt removed from monitor and garage door closed.

## 2017-09-11 NOTE — BH Assessment (Signed)
[facility] by Dr. Toni Amendlapacs.  Attending Physician will be Dr. Johnella MoloneyMcNew.   Patient has been assigned to room [facility]B, [facility] Lillette BoxerGwen F.   ER staff is aware of the admission:  Davy PiqueLuan, ER Sectary   Dr. Pershing ProudSchaevitz, ER MD   Amy B., Patient's Nurse   Evalyn CascoJeanlle, Patient Access.

## 2017-09-11 NOTE — Consult Note (Signed)
Sheila Hill Psychiatry Consult   Reason for Consult: Consult for 27 year old woman who presented to the hospital after taking an intentional overdose Referring Physician: Reita Cliche Patient Identification: Sheila Hill MRN:  [id number] Principal Diagnosis: Severe major depression, single episode, without psychotic features [facility]) Diagnosis:   Patient Active Problem List   Diagnosis Date Noted  . Severe major depression, single episode, without psychotic features ([place]) [F32.2] 09/11/2017  . Suicide attempt ([place]) [T14.91XA] 09/11/2017  . Epilepsy ([place]) [X73.532] 09/11/2017    Total Time spent with patient: 1 hour  Subjective:   Sheila Hill is a 27 y.o. female patient admitted with "I took some pills".  HPI: Patient interviewed chart reviewed.  27 year old woman brought to the hospital after taking an overdose.  Evidently some friends got worried about her when she was not answering her phone and so they called her family.  Family found her passed out at home.  Patient says that she is just feeling tired of everything.  She feels overwhelmed by her stress.  She is living with her parents and taking care of her 3 children by herself at home.  She is working.  Feels upset about her relationship with her ex-husband.  Mood is been chronically depressed but getting worse recently.  Sleep is a little bit impaired.  Appetite unclear but she says she has lost weight.  No hallucinations no thoughts of violence.  Patient took tramadol.  She tells me very clearly that she was wanting to die at the time.  She is not currently seeing anyone for any mental health treatment.  Social history: Separated from her husband.  3 children ages 14 7 and 11 all live with the patient.  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  Medical history: Patient has epilepsy of fairly new onset within the last couple years.  Sees a neurologist through the [facility].  She is on lamotrigine and Depakote.  She  says her last seizure was in December.  No known cause for the epilepsy.  Substance abuse history: Denies any history of alcohol or drug abuse  Past Psychiatric History: Patient says she has been depressed for a long time but has never seen anyone for treatment.  No history of hospitalization.  No previous suicide attempts no history of medication.  Risk to Self: Suicidal Ideation: No-Not Currently/Within Last 6 Months Suicidal Intent: No-Not Currently/Within Last 6 Months Is patient at risk for suicide?: Yes Suicidal Plan?: Yes-Currently Present Specify Current Suicidal Plan: overdose by medication Access to Means: Yes Specify Access to Suicidal Means: home medications What has been your use of drugs/alcohol within the last 12 months?: pt denies How many times?: 0 Other Self Harm Risks: pt denies Triggers for Past Attempts: None known Intentional Self Injurious Behavior: None Risk to Others: Homicidal Ideation: No Thoughts of Harm to Others: No Current Homicidal Intent: No Current Homicidal Plan: No Access to Homicidal Means: No Identified Victim: none noted History of harm to others?: No Assessment of Violence: None Noted Violent Behavior Description: pt denies violent behaviors Does patient have access to weapons?: No Criminal Charges Pending?: No Does patient have a court date: No Prior Inpatient Therapy: Prior Inpatient Therapy: No Prior Therapy Dates: n/a Prior Therapy Facilty/Provider(s): n/a Reason for Treatment: n/a Prior Outpatient Therapy: Prior Outpatient Therapy: No Prior Therapy Dates: n/a Prior Therapy Facilty/Provider(s): n/a Reason for Treatment: n/a Does patient have an ACCT team?: No Does patient have Intensive In-House Services?  : No Does patient  have [facility] services? : No Does patient have P4CC services?: No  Past Medical History:  Past Medical History:  Diagnosis Date  . Chronic back pain   . Scoliosis   . Seizure [facility])     Past Surgical  History:  Procedure Laterality Date  . CESAREAN SECTION     Family History: No family history on file. Family Psychiatric  History: Does not know of any family history Social History:  Social History   Substance and Sexual Activity  Alcohol Use No     Social History   Substance and Sexual Activity  Drug Use No    Social History   Socioeconomic History  . Marital status: Single    Spouse name: None  . Number of children: None  . Years of education: None  . Highest education level: None  Social Needs  . Financial resource strain: None  . Food insecurity - worry: None  . Food insecurity - inability: None  . Transportation needs - medical: None  . Transportation needs - non-medical: None  Occupational History  . None  Tobacco Use  . Smoking status: Never Smoker  . Smokeless tobacco: Never Used  Substance and Sexual Activity  . Alcohol use: No  . Drug use: No  . Sexual activity: None  Other Topics Concern  . None  Social History Narrative  . None   Additional Social History:    Allergies:  No Known Allergies  Labs:  Results for orders placed or performed during the hospital encounter of 09/10/17 (from the past 48 hour(s))  Comprehensive metabolic panel     Status: Abnormal   Collection Time: 09/10/17 11:58 PM  Result Value Ref Range   Sodium 140 135 - 145 mmol/L   Potassium 3.7 3.5 - 5.1 mmol/L   Chloride 104 101 - 111 mmol/L   CO2 28 22 - 32 mmol/L   Glucose, Bld 89 65 - 99 mg/dL   BUN 12 6 - 20 mg/dL   Creatinine, Ser 0.85 0.44 - 1.00 mg/dL   Calcium 8.5 (L) 8.9 - 10.3 mg/dL   Total Protein 7.5 6.5 - 8.1 g/dL   Albumin 3.9 3.5 - 5.0 g/dL   AST 20 15 - 41 U/L   ALT 10 (L) 14 - 54 U/L   Alkaline Phosphatase 42 38 - 126 U/L   Total Bilirubin 0.5 0.3 - 1.2 mg/dL   GFR calc non Af Amer >60 >60 mL/min   GFR calc Af Amer >60 >60 mL/min    Comment: (NOTE) The eGFR has been calculated using the CKD EPI equation. This calculation has not been validated in  all clinical situations. eGFR's persistently <60 mL/min signify possible Chronic Kidney Disease.    Anion gap 8 5 - 15    Comment: Performed at [facility], [address] [place], [place] [address]  Ethanol     Status: None   Collection Time: 09/10/17 11:58 PM  Result Value Ref Range   Alcohol, Ethyl (B) <10 <10 mg/dL    Comment:        LOWEST DETECTABLE LIMIT FOR SERUM ALCOHOL IS 10 mg/dL FOR MEDICAL PURPOSES ONLY Performed at [facility], [address] [place], [place] [address]   cbc     Status: None   Collection Time: 09/10/17 11:58 PM  Result Value Ref Range   WBC 6.7 3.6 - 11.0 K/uL   RBC 3.93 3.80 - 5.20 MIL/uL   Hemoglobin 13.3 12.0 - 16.0 g/dL   HCT  38.6 35.0 - 47.0 %   MCV 98.3 80.0 - 100.0 fL   MCH 33.9 26.0 - 34.0 pg   MCHC 34.5 32.0 - 36.0 g/dL   RDW 12.8 11.5 - 14.5 %   Platelets 180 150 - 440 K/uL    Comment: Performed at [facility], [address] [place], [place] [address]  Urine Drug Screen, Qualitative     Status: None   Collection Time: 09/10/17 11:58 PM  Result Value Ref Range   Tricyclic, Ur Screen NONE DETECTED NONE DETECTED   Amphetamines, Ur Screen NONE DETECTED NONE DETECTED   MDMA (Ecstasy)Ur Screen NONE DETECTED NONE DETECTED   Cocaine Metabolite,Ur [place] NONE DETECTED NONE DETECTED   Opiate, Ur Screen NONE DETECTED NONE DETECTED   Phencyclidine (PCP) Ur S NONE DETECTED NONE DETECTED   Cannabinoid 50 Ng, Ur [place] NONE DETECTED NONE DETECTED   Barbiturates, Ur Screen NONE DETECTED NONE DETECTED   Benzodiazepine, Ur Scrn NONE DETECTED NONE DETECTED   Methadone Scn, Ur NONE DETECTED NONE DETECTED    Comment: (NOTE) Tricyclics + metabolites, urine    Cutoff 1000 ng/mL Amphetamines + metabolites, urine  Cutoff 1000 ng/mL MDMA (Ecstasy), urine              Cutoff 500 ng/mL Cocaine Metabolite, urine          Cutoff 300 ng/mL Opiate + metabolites, urine        Cutoff 300 ng/mL Phencyclidine (PCP), urine          Cutoff 25 ng/mL Cannabinoid, urine                 Cutoff 50 ng/mL Barbiturates + metabolites, urine  Cutoff 200 ng/mL Benzodiazepine, urine              Cutoff 200 ng/mL Methadone, urine                   Cutoff 300 ng/mL The urine drug screen provides only a preliminary, unconfirmed analytical test result and should not be used for non-medical purposes. Clinical consideration and professional judgment should be applied to any positive drug screen result due to possible interfering substances. A more specific alternate chemical method must be used in order to obtain a confirmed analytical result. Gas chromatography / mass spectrometry (GC/MS) is the preferred confirmat ory method. Performed at [facility], [address] [place], [place] [address]   Acetaminophen level     Status: Abnormal   Collection Time: 09/10/17 11:58 PM  Result Value Ref Range   Acetaminophen (Tylenol), Serum <10 (L) 10 - 30 ug/mL    Comment:        THERAPEUTIC CONCENTRATIONS VARY SIGNIFICANTLY. A RANGE OF 10-30 ug/mL MAY BE AN EFFECTIVE CONCENTRATION FOR MANY PATIENTS. HOWEVER, SOME ARE BEST TREATED AT CONCENTRATIONS OUTSIDE THIS RANGE. ACETAMINOPHEN CONCENTRATIONS >150 ug/mL AT 4 HOURS AFTER INGESTION AND >50 ug/mL AT 12 HOURS AFTER INGESTION ARE OFTEN ASSOCIATED WITH TOXIC REACTIONS. Performed at [facility], [address] [place], [place] [address]   Salicylate level     Status: None   Collection Time: 09/10/17 11:58 PM  Result Value Ref Range   Salicylate Lvl <9.4 2.8 - 30.0 mg/dL    Comment: Performed at [facility], [address] [place], [place] [address]  Acetaminophen level     Status: Abnormal   Collection Time: 09/11/17  3:49 AM  Result Value Ref Range   Acetaminophen (Tylenol), Serum <10 (L) 10 - 30 ug/mL  Comment:        THERAPEUTIC CONCENTRATIONS VARY SIGNIFICANTLY. A RANGE OF 10-30 ug/mL MAY BE AN EFFECTIVE CONCENTRATION FOR MANY  PATIENTS. HOWEVER, SOME ARE BEST TREATED AT CONCENTRATIONS OUTSIDE THIS RANGE. ACETAMINOPHEN CONCENTRATIONS >150 ug/mL AT 4 HOURS AFTER INGESTION AND >50 ug/mL AT 12 HOURS AFTER INGESTION ARE OFTEN ASSOCIATED WITH TOXIC REACTIONS. Performed at [facility], [address] [place], [place] [address]   Salicylate level     Status: None   Collection Time: 09/11/17  3:49 AM  Result Value Ref Range   Salicylate Lvl <0.3 2.8 - 30.0 mg/dL    Comment: Performed at [facility], [address] [place],  [address]    Current Facility-Administered Medications  Medication Dose Route Frequency Provider Last Rate Last Dose  . divalproex (DEPAKOTE) DR tablet 250 mg  250 mg Oral TID Lisa Roca, MD   250 mg at 09/11/17 1022  . lamoTRIgine (LAMICTAL) tablet 150 mg  150 mg Oral BID Lisa Roca, MD   150 mg at 09/11/17 1022   Current Outpatient Medications  Medication Sig Dispense Refill  . ketorolac (TORADOL) 10 MG tablet Take 10 mg by mouth every 6 (six) hours as needed.    . divalproex (DEPAKOTE) 250 MG DR tablet Take 1 tablet (250 mg total) by mouth 3 (three) times daily. 90 tablet 0  . etonogestrel (NEXPLANON) 68 MG IMPL implant 1 each by Subdermal route once.    . lacosamide (VIMPAT) 50 MG TABS tablet Take 1 tablet (50 mg total) by mouth 2 (two) times daily. 60 tablet 1  . lamoTRIgine (LAMICTAL) 150 MG tablet Take 1 tablet (150 mg total) by mouth 2 (two) times daily. 60 tablet 1    Musculoskeletal: Strength & Muscle Tone: within normal limits Gait & Station: normal Patient leans: N/A  Psychiatric Specialty Exam: Physical Exam  Nursing note and vitals reviewed. Constitutional: She appears well-developed and well-nourished.  HENT:  Head: Normocephalic and atraumatic.  Eyes: Conjunctivae are normal. Pupils are equal, round, and reactive to light.  Neck: Normal range of motion.  Cardiovascular: Regular rhythm and normal heart sounds.  Respiratory: Effort  normal. No respiratory distress.  GI: Soft.  Musculoskeletal: Normal range of motion.  Neurological: She is alert.  Skin: Skin is warm and dry.  Psychiatric: Her speech is delayed. She is slowed and withdrawn. Cognition and memory are normal. She expresses impulsivity. She exhibits a depressed mood. She expresses suicidal ideation. She expresses suicidal plans.    Review of Systems  Constitutional: Negative.   HENT: Negative.   Eyes: Negative.   Respiratory: Negative.   Cardiovascular: Negative.   Gastrointestinal: Negative.   Musculoskeletal: Negative.   Skin: Negative.   Neurological: Positive for seizures.  Psychiatric/Behavioral: Positive for depression, memory loss and suicidal ideas. Negative for hallucinations and substance abuse. The patient is nervous/anxious and has insomnia.     Blood pressure 105/63, pulse 81, temperature 98.2 F (36.8 C), temperature source Oral, resp. rate 19, SpO2 100 %.There is no height or weight on file to calculate BMI.  General Appearance: Casual  Eye Contact:  Fair  Speech:  Slow  Volume:  Decreased  Mood:  Depressed  Affect:  Constricted, Depressed and Tearful  Thought Process:  Goal Directed  Orientation:  Full (Time, Place, and Person)  Thought Content:  Logical  Suicidal Thoughts:  Yes.  with intent/plan  Homicidal Thoughts:  No  Memory:  Immediate;   Fair Recent;   Fair Remote;   Fair  Judgement:  Impaired  Insight:  Shallow  Psychomotor Activity:  Decreased  Concentration:  Concentration: Fair  Recall:  [employer] of Knowledge:  Fair  Language:  Fair  Akathisia:  No  Handed:  Right  AIMS (if indicated):     Assets:  Agricultural consultant Housing Resilience Social Support  ADL's:  Impaired  Cognition:  Impaired,  Mild  Sleep:        Treatment Plan Summary: Daily contact with patient to assess and evaluate symptoms and progress in treatment, Medication management and Plan 27 year old woman  made a serious suicide attempt.  Continues to be depressed and tearful and down.  No outpatient treatment going on.  Feels under a lot of stress.  Patient meets commitment criteria and is appropriate for admission to the hospital.  Case reviewed with TTS and emergency room physician.  Currently patient is on lamotrigine and Depakote for her seizures.  I will try to make sure that she is on the correct doses.  Get a full set of labs.  May want to start antidepressants with a choice such as mirtazapine.  Patient aware that we will try to get her into the hospital as soon as possible or refer her if no beds are available here.  Continue IV C.  Disposition: Recommend psychiatric Inpatient admission when medically cleared. Supportive therapy provided about ongoing stressors.  Alethia Berthold, MD 09/11/2017 2:15 PM

## 2017-09-11 NOTE — ED Notes (Signed)
ED Is the patient under IVC or is there intent for IVC: Yes.   Is the patient medically cleared: Yes.   Is there vacancy in the ED BHU: Yes.   Is the population mix appropriate for patient: Yes.   Is the patient awaiting placement in inpatient or outpatient setting:   Has the patient had a psychiatric consult: pending Survey of unit performed for contraband, proper placement and condition of furniture, tampering with fixtures in bathroom, shower, and each patient room: Yes.  ; Findings:  APPEARANCE/BEHAVIOR Calm and cooperative NEURO ASSESSMENT Orientation: oriented x4  Denies pain Hallucinations: No.None noted (Hallucinations) denies Speech: Normal Gait: normal RESPIRATORY ASSESSMENT Even  Unlabored respirations  CARDIOVASCULAR ASSESSMENT Pulses equal   regular rate  Skin warm and dry   GASTROINTESTINAL ASSESSMENT no GI complaint EXTREMITIES Full ROM  PLAN OF CARE Provide calm/safe environment. Vital signs assessed twice daily. ED BHU Assessment once each 12-hour shift. Collaborate with TTS daily or as condition indicates. Assure the ED provider has rounded once each shift. Provide and encourage hygiene. Provide redirection as needed. Assess for escalating behavior; address immediately and inform ED provider.  Assess family dynamic and appropriateness for visitation as needed: Yes.  ; If necessary, describe findings:  Educate the patient/family about BHU procedures/visitation: Yes.  ; If necessary, describe findings:

## 2017-09-11 NOTE — ED Notes (Signed)
Patient observed lying in bed with eyes closed  Even, unlabored respirations observed   NAD pt appears to be sleeping  I will continue to monitor along with every 15 minute visual observations and ongoing security monitoring    

## 2017-09-11 NOTE — ED Notes (Addendum)

## 2017-09-11 NOTE — ED Notes (Signed)
Pt respiratory rate slows to 10. Pt is hypotensive. Pt is easily woken. Pt is alert and oriented. resp rate returns to normal. Pt given PO fluids. MD aware. IV fluids given.

## 2017-09-11 NOTE — ED Notes (Signed)
Pt ambulated to bathroom without assistance 

## 2017-09-11 NOTE — Tx Team (Signed)
Initial Treatment Plan 09/11/2017 11:45 PM Sheila StaiJeanette I Ku ION:[id number]    PATIENT STRESSORS: Financial difficulties Health problems Marital or family conflict   PATIENT STRENGTHS: Ability for insight Active sense of humor Average or above average intelligence Capable of independent living Communication skills Motivation for treatment/growth Work skills   PATIENT IDENTIFIED PROBLEMS: Depression 09/11/2017  Suicidal ideation 09/10/2017  Anxiety 09/11/2017                 DISCHARGE CRITERIA:  Improved stabilization in mood, thinking, and/or behavior Motivation to continue treatment in a less acute level of care Verbal commitment to aftercare and medication compliance  PRELIMINARY DISCHARGE PLAN: Outpatient therapy Return to previous living arrangement  PATIENT/FAMILY INVOLVEMENT: This treatment plan has been presented to and reviewed with the patient, Sheila StaiJeanette I Reuter, and/or family member.  The patient and family have been given the opportunity to ask questions and make suggestions.  Galen ManilaAlexis E Shandon [place]lingame, RN 09/11/2017, 11:45 PM

## 2017-09-11 NOTE — ED Notes (Signed)
Consultant is currently visiting with her

## 2017-09-11 NOTE — ED Notes (Signed)
Patient asleep in room. No noted distress or abnormal behavior. Will continue 15 minute checks and observation by security cameras for safety. 

## 2017-09-12 ENCOUNTER — Encounter: Payer: Self-pay | Admitting: Psychiatry

## 2017-09-12 DIAGNOSIS — F322 Major depressive disorder, single episode, severe without psychotic features: Principal | ICD-10-CM

## 2017-09-12 LAB — HCG, QUANTITATIVE, PREGNANCY

## 2017-09-12 LAB — TSH: TSH: 2.197 u[IU]/mL (ref 0.350–4.500)

## 2017-09-12 LAB — LIPID PANEL
Cholesterol: 124 mg/dL (ref 0–200)
HDL: 35 mg/dL — ABNORMAL LOW (ref >40)
LDL Cholesterol: 63 mg/dL (ref 0–99)
Total CHOL/HDL Ratio: 3.5 RATIO
Triglycerides: 129 mg/dL (ref <150)
VLDL: 26 mg/dL (ref 0–40)

## 2017-09-12 LAB — HEMOGLOBIN A1C
HEMOGLOBIN A1C: 4.5 % — AB (ref 4.8–5.6)
MEAN PLASMA GLUCOSE: 82.45 mg/dL

## 2017-09-12 MED ORDER — DIVALPROEX SODIUM 250 MG PO DR TAB: 250.0000 mg | DELAYED_RELEASE_TABLET | Freq: Three times a day (TID) | ORAL | 1 refills | Status: DC

## 2017-09-12 MED ORDER — MIRTAZAPINE 15 MG PO TABS: 15.0000 mg | ORAL_TABLET | Freq: Every day | ORAL | 1 refills | Status: DC

## 2017-09-12 MED ORDER — DIVALPROEX SODIUM 250 MG PO DR TAB
250.0000 mg | DELAYED_RELEASE_TABLET | Freq: Two times a day (BID) | ORAL | Status: DC
  Administered 2017-09-12 – 2017-09-13 (×2): 250 mg via ORAL
  Filled 2017-09-12 (×2): qty 1

## 2017-09-12 MED ORDER — LAMOTRIGINE 150 MG PO TABS: 150.0000 mg | ORAL_TABLET | Freq: Two times a day (BID) | ORAL | 1 refills | Status: AC

## 2017-09-12 NOTE — [facility] Suicide Risk Assessment (Signed)
[facility]   Nursing information obtained from:  Patient Demographic factors:  Divorced or widowed, Low socioeconomic status Current Mental Status:  NA Loss Factors:  Loss of significant relationship, Financial problems / change in socioeconomic status Historical Factors:  NA Risk Reduction Factors:  Responsible for children under 28 years of age, Sense of responsibility to family, Living with another person, especially a relative  Total Time spent with patient: 1 hour Principal Problem: Severe major depression, single episode, without psychotic features ([facility]) Diagnosis:   Patient Active Problem List   Diagnosis Date Noted  . Severe major depression, single episode, without psychotic features ([facility]) [F32.2] 09/11/2017    Priority: High  . Suicide attempt ([facility]) [T14.91XA] 09/11/2017  . Epilepsy ([facility]) [G40.909] 09/11/2017   Subjective Data: overdose  Continued Clinical Symptoms:  Alcohol Use Disorder Identification Test Final Score (AUDIT): 0 The "Alcohol Use Disorders Identification Test", Guidelines for Use in Primary Care, Second Edition.  World Science writerHealth Organization [facility](WHO). Score between 0-7:  no or low risk or alcohol related problems. Score between 8-15:  moderate risk of alcohol related problems. Score between 16-19:  high risk of alcohol related problems. Score 20 or above:  warrants further diagnostic evaluation for alcohol dependence and treatment.   CLINICAL FACTORS:   Depression:   Impulsivity Epilepsy   Musculoskeletal: Strength & Muscle Tone: within normal limits Gait & Station: normal Patient leans: N/A  Psychiatric Specialty Exam: Physical Exam  Nursing note and vitals reviewed. Psychiatric: She has a normal mood and affect. Her speech is normal and behavior is normal. Judgment normal. Cognition and memory are normal.    Review of Systems  Neurological: Positive for seizures.  Psychiatric/Behavioral: Positive for depression.  All  other systems reviewed and are negative.   Blood pressure (!) 90/55, pulse 74, temperature 97.9 F (36.6 C), temperature source Oral, resp. rate 18, height 5\' 2"  (1.575 m), weight 51.7 kg (114 lb), last menstrual period 09/07/2017, SpO2 100 %.Body mass index is 20.85 kg/m.  General Appearance: Casual  Eye Contact:  Good  Speech:  Clear and Coherent  Volume:  Normal  Mood:  Anxious  Affect:  Appropriate  Thought Process:  Goal Directed and Descriptions of Associations: Intact  Orientation:  Full (Time, Place, and Person)  Thought Content:  WDL  Suicidal Thoughts:  No  Homicidal Thoughts:  No  Memory:  Immediate;   Fair Recent;   Fair Remote;   Fair  Judgement:  Impaired  Insight:  Shallow  Psychomotor Activity:  Normal  Concentration:  Concentration: Fair and Attention Span: Fair  Recall:  [employer]Fair  Fund of Knowledge:  Fair  Language:  Fair  Akathisia:  No  Handed:  Right  AIMS (if indicated):     Assets:  Communication Skills Desire for Improvement Financial Resources/Insurance Housing Physical Health Resilience Social Support Talents/Skills Transportation Vocational/Educational  ADL's:  Intact  Cognition:  WNL  Sleep:  Number of Hours: 6      COGNITIVE FEATURES THAT CONTRIBUTE TO RISK:  None    SUICIDE RISK:   Minimal: No identifiable suicidal ideation.  Patients presenting with no risk factors but with morbid ruminations; may be classified as minimal risk based on the severity of the depressive symptoms  PLAN OF CARE: hospital admission, medication management, discharge planning.  [name] is a 27 year old female with no past psychiatric history admitted after overdose in the context of relationship problems.  #Suicidal ideation -patient adamantly denies any thoughts intention or plans to hurt herself  or others -able to contract for safety  #Mood -continue Remeron 15 mg nightly  #Seizure disorder -continue Depakote 250 mg BID and Lamictal 150 mg BID as  prescribed by Dr. Malvin Johns  #Disposition -discharge to home with family -follow up with [facility] -needs medication management  I certify that inpatient services furnished can reasonably be expected to improve the patient's condition.   Kristine Linea, MD 09/12/2017, 3:13 PM

## 2017-09-12 NOTE — Progress Notes (Signed)
Received Sheila Hill this AM in her room, she refused breakfast, but got up for lunch with the request of an escort. She verbalized feeling uncomfortable on the psychiatric unit. This is her first admission. She denied all of the psychiatric symptoms including feeling suicidal and hoping to be discharge home soon. She made several phone calls and retreated back to her room.

## 2017-09-12 NOTE — B[facility] Group Notes (Signed)
  09/12/2017 9am  Type of Therapy and Topic: Group Therapy: Goals Group: SMART Goals   Participation Level: Did Not Attend  Description of Group:    The purpose of a daily goals group is to assist and guide patients in setting recovery/wellness-related goals. The objective is to set goals as they relate to the crisis in which they were admitted. Patients will be using SMART goal modalities to set measurable goals. Characteristics of realistic goals will be discussed and patients will be assisted in setting and processing how one will reach their goal. Facilitator will also assist patients in applying interventions and coping skills learned in psycho-education groups to the SMART goal and process how one will achieve defined goal.   Therapeutic Goals:   -Patients will develop and document one goal related to or their crisis in which brought them into treatment.  -Patients will be guided by LCSW using SMART goal setting modality in how to set a measurable, attainable, realistic and time sensitive goal.  -Patients will process barriers in reaching goal.  -Patients will process interventions in how to overcome and successful in reaching goal.   Patient's Goal: Patient was encouraged and invited to attend group. Patient did not attend group. Social worker will continue to encourage group participation in the future.    Therapeutic Modalities:  Motivational Interviewing  Cognitive Behavioral Therapy  Crisis Intervention Model  SMART goals setting  Johny ShearsCassandra  Lusine Corlett, Kentucky[place]CS[place] 09/12/2017 9:33 AM

## 2017-09-12 NOTE — [facility] Counselor (Signed)
Adult Comprehensive Assessment  Patient ID: Sheila Hill, female   DOB: Dec 11, 1990, 27 y.o.   MRN: [id number]  Information Source: Information source: Patient  Current Stressors:  Educational / Learning stressors: No issues reported.  Employment / Job issues: Pt is employed, no issues reported.  Family Relationships: Pt reports her boyfriend is not supportive and states "conflict," with him but declined to provide further information.  Financial / Lack of resources (include bankruptcy): Pt reports, "I just feel like I can't provide for my kids like I want to."  Housing / Lack of housing: Pt stated, "I moved back in with my parents recently."  Physical health (include injuries & life threatening diseases): No issues reported.  Social relationships: Pt stated, "I just feel unloved."  Substance abuse: No substance use reported.  Bereavement / Loss: No bereavement reported.   Living/Environment/Situation:  Living Arrangements: Parent, Children Living conditions (as described by patient or guardian): "Fine. It's more recent."  How long has patient lived in current situation?: "Not long."  What is atmosphere in current home: Comfortable  Family History:  Marital status: Single Are you sexually active?: Yes What is your sexual orientation?: Heterosexual  Has your sexual activity been affected by drugs, alcohol, medication, or emotional stress?: Pt denies.  Does patient have children?: Yes How many children?: 3(Pt has two daughters (ages 33 and 70) and one son, age 50. Pt's children are currently with pt's parents.) How is patient's relationship with their children?: "Great."   Childhood History:  By whom was/is the patient raised?: Both parents Additional childhood history information: None reported.  Description of patient's relationship with caregiver when they were a child: "Great."  Patient's description of current relationship with people who raised him/her: "Great."  How were you  disciplined when you got in trouble as a child/adolescent?: Pt stated, "My dad just talked to [place]."  Does patient have siblings?: Yes Number of Siblings: 3(two sisters and one brother.) Description of patient's current relationship with siblings: "good."  Did patient suffer any verbal/emotional/physical/sexual abuse as a child?: No Did patient suffer from severe childhood neglect?: No Has patient ever been sexually abused/assaulted/raped as an adolescent or adult?: No Was the patient ever a victim of a crime or a disaster?: No Witnessed domestic violence?: No Has patient been effected by domestic violence as an adult?: No  Education:  Highest grade of school patient has completed: Music therapist program following high school."  Currently a student?: No Learning disability?: No  Employment/Work Situation:   Employment situation: Employed 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  If attempted suicide, did drugs/alcohol play a role in this?: No Alcohol/Substance Abuse Treatment Hx: Denies past history If yes, describe treatment: No previous history.  Has alcohol/substance abuse ever caused legal  problems?: No  Social Support System:   Museum/gallery curatoratient's  Community Support System: Poor Describe Community Support System: Pt reports, "I don't really have anyone besides my family."  Type of faith/religion: Catholic  How does patient's faith help to cope with current illness?: Prayer   Leisure/Recreation:   Leisure and Hobbies: "I don't do much. I go out with my kids sometimes."   Strengths/Needs:   What things does the patient do well?: Family support In what areas does patient struggle / problems for patient: limited community support, medication management.   Discharge Plan:   Does patient have access to transportation?: Yes Will patient be returning to same living situation after discharge?: Yes Currently receiving community mental health services: No If no, would patient like referral for services when discharged?: Yes (What county?)([place]) Does patient have financial barriers related to discharge medications?: No  Summary/Recommendations:   Summary and Recommendations (to be completed by the evaluator): Pt is a 27 year old female who presents to BMU on an IVC. Pt reports, "I took some pills because I've had a lot of stress. I feel like I can't provide for my children the way I should. I am having problems with my kids' father, and I felt like nobody loved me." Pt denies current SI, HI, or AVH. Pt denies alcohol or substance use. Pt denies history of trauma or abuse. Pt reports living with her parents and her three children. Pt reports no previous inpatient or outpatient treatment history.  Current recommendations for this patient include: therapeutic milieu, encouragement to attend and participate group therapy, crisis stabilization, and the development of a comprehensive mental wellness plan.   Sheila DachKelsey Shanteria Laye, LCSW. 09/12/2017

## 2017-09-12 NOTE — [facility] Suicide Risk Assessment (Signed)
[facility] INPATIENT:  Family/Significant Other Suicide Prevention Education  Suicide Prevention Education:  Education Completed; Sheila Hill, pt's father at [phone], has been identified by the patient as the family member/significant other with whom the patient will be residing, and identified as the person(s) who will aid the patient in the event of a mental health crisis (suicidal ideations/suicide attempt).  With written consent from the patient, the family member/significant other has been provided the following suicide prevention education, prior to the and/or following the discharge of the patient.  The suicide prevention education provided includes the following:  Suicide risk factors  Suicide prevention and interventions  National Suicide Hotline telephone number  Sheila Hill assessment telephone number  Sheila Hill 911  Sheila Hill telephone number  Request made of family/significant other to:  Remove weapons (e.g., guns, rifles, knives), all items previously/currently identified as safety concern.    Remove drugs/medications (over-the-counter, prescriptions, illicit drugs), all items previously/currently identified as a safety concern.  The family member/significant other verbalizes understanding of the suicide prevention education information provided.  The family member/significant other agrees to remove the items of safety concern listed above.  Pt's father added, "I worry about her. I want to know what's going to happen. I went to see her yesterday and see she's kind of depressed or down or something. When she saw me she started crying. I found a lot of stuff on her phone. Her boyfriend treats her like trash. He doesn't respect her and stuff like that. He's a very long story. They were living together, then I heard they weren't doing so good, so three times I took her home with the kids. The  last time, finally, I got all of her stuff out of there. We have her kids, they're living with us now. She was very depressed because he had been treating her like nobody. She's been working and taking care of the kids--and he doesn't do nothing. They were behind on the rent. I was helping her but it gets to the point where you have to do something. She was so happy at home. When I saw her phone, I found out a lot of stuff." Pt's father confirmed that pt is able to return home and will not have access to weapons or other means of self harm.   Sheila DachKelsey Cruzita Lipa, LCSW 09/12/2017, 9:14 AM

## 2017-09-12 NOTE — Plan of Care (Addendum)
Patient found in her room upon my arrival. Patient is apprehensive regarding interacting with peers and was neither visible nor social this evening. Reports poor appetite earlier in the day but was able to eat a large snack and sandwich tray this evening. Patient affect is brighter and she is grateful for the resources to which she has been alerted because of her hospitalization. Reports feeling ready for discharge. Denies SI, HI, AVH. Denies pain. Given Trazodone for sleep. Will monitor for efficacy. Educated patient regarding her medications. Patient verbalized understanding. Q 15 minute checks maintained. Will continue to monitor throughout the shift.  Patient slept 8 hours. No apparent distress. Will endorse care to oncoming shift.   Progressing Education: Knowledge of the prescribed therapeutic regimen will improve 09/12/2017 2205 - Progressing by Galen ManilaVigil, Krystel Fletchall E, RN Coping: Ability to cope will improve 09/12/2017 2205 - Progressing by Galen ManilaVigil, Dorthea Maina E, RN Ability to verbalize feelings will improve 09/12/2017 2205 - Progressing by Galen ManilaVigil, Jacky Dross E, RN Safety: Ability to disclose and discuss suicidal ideas will improve 09/12/2017 2205 - Progressing by Galen ManilaVigil, Taijon Vink E, RN Self-Concept: Ability to verbalize positive feelings about self will improve 09/12/2017 2205 - Progressing by Galen ManilaVigil, Nyasha Rahilly E, RN Level of anxiety will decrease 09/12/2017 2205 - Progressing by Galen ManilaVigil, Jemaine Prokop E, RN Medication: Compliance with prescribed medication regimen will improve 09/12/2017 2205 - Progressing by Galen ManilaVigil, Sebrena Engh E, RN

## 2017-09-12 NOTE — H&P (Signed)
Psychiatric Admission Assessment Adult  Patient Identification: Sheila Hill MRN:  [id number] Date of Evaluation:  09/12/2017 Chief Complaint:  Depression Principal Diagnosis: Severe major depression, single episode, without psychotic features (HCC) Diagnosis:   Patient Active Problem List   Diagnosis Date Noted  . Severe major depression, single episode, without psychotic features (HCC) [F32.2] 09/11/2017    Priority: High  . Suicide attempt (HCC) [T14.91XA] 09/11/2017  . Epilepsy (HCC) [G40.909] 09/11/2017   History of Present Illness:   Identifying data. Sheila Hill is a 27 year old female with a history of mild depression.  Chief complaint. "I felt that he is holding me back."  History of present illness. Information was obtained from the patient and the chart. The patient has been somewhat depressed since she separated from her partner of 11 years in September of 2018. The conflict is still unresolved and the patient felt that she has not been able to deal with the stress any longer. She took overdose of Tramadol but luckily she texted her friends. She was asleep when the family found her. She is glad to be alive and apologetic about her act. It was impulsive, she did not plan it. She reports anhedonia, crying spells, social isolation, feeling of helplessness. She denies excessive anxiety or psychosis. No substances are involved.   Past psychiatric history. None reported.  Family psychiatric history. None reported.  Social history. Since separation, she lives with her parents and her 3 children ages 66, 110, and 46. She works as a Sales executive. She is about to lose her Medicaid this month. She wants to go to school to be a Armed forces operational officer.  Total Time spent with patient: 1 hour  Is the patient at risk to self? No.  Has the patient been a risk to self in the past 6 months? No.  Has the patient been a risk to self within the distant past? No.  Is the patient a risk to others?  No.  Has the patient been a risk to others in the past 6 months? No.  Has the patient been a risk to others within the distant past? No.   Prior Inpatient Therapy:   Prior Outpatient Therapy:    Alcohol Screening: 1. How often do you have a drink containing alcohol?: Never 2. How many drinks containing alcohol do you have on a typical day when you are drinking?: 1 or 2 3. How often do you have six or more drinks on one occasion?: Never AUDIT-C Score: 0 4. How often during the last year have you found that you were not able to stop drinking once you had started?: Never 5. How often during the last year have you failed to do what was normally expected from you becasue of drinking?: Never 6. How often during the last year have you needed a first drink in the morning to get yourself going after a heavy drinking session?: Never 7. How often during the last year have you had a feeling of guilt of remorse after drinking?: Never 8. How often during the last year have you been unable to remember what happened the night before because you had been drinking?: Never 9. Have you or someone else been injured as a result of your drinking?: No 10. Has a relative or friend or a doctor or another health worker been concerned about your drinking or suggested you cut down?: No Alcohol Use Disorder Identification Test Final Score (AUDIT): 0 Intervention/Follow-up: AUDIT Score <7 follow-up not indicated Substance Abuse History  in the last 12 months:  No. Consequences of Substance Abuse: NA Previous Psychotropic Medications: No  Psychological Evaluations: No  Past Medical History:  Past Medical History:  Diagnosis Date  . Chronic back pain   . Scoliosis   . Seizure [facility](HCC)     Past Surgical History:  Procedure Laterality Date  . CESAREAN SECTION     Family History: History reviewed. No pertinent family history.  Tobacco Screening: Have you used any form of tobacco in the last 30 days? (Cigarettes,  Smokeless Tobacco, Cigars, and/or Pipes): No Social History:  Social History   Substance and Sexual Activity  Alcohol Use No     Social History   Substance and Sexual Activity  Drug Use No    Additional Social History: Marital status: Single Are you sexually active?: Yes What is your sexual orientation?: Heterosexual  Has your sexual activity been affected by drugs, alcohol, medication, or emotional stress?: Pt denies.  Does patient have children?: Yes How many children?: 3(Pt has two daughters (ages 237 and 248) and one son, age 496. Pt's children are currently with pt's parents.) How is patient's relationship with their children?: "Great."     History of alcohol / drug use?: No history of alcohol / drug abuse                    Allergies:  No Known Allergies Lab Results:  Results for orders placed or performed during the hospital encounter of 09/11/17 (from the past 48 hour(s))  Hemoglobin A1c     Status: Abnormal   Collection Time: 09/12/17  7:08 AM  Result Value Ref Range   Hgb A1c MFr Bld 4.5 (L) 4.8 - 5.6 %    Comment: (NOTE) Pre diabetes:          5.7%-6.4% Diabetes:              >6.4% Glycemic control for   <7.0% adults with diabetes    Mean Plasma Glucose 82.45 mg/dL    Comment: Performed at [facility], [address] [place], [place] [address]  Lipid panel     Status: Abnormal   Collection Time: 09/12/17  7:08 AM  Result Value Ref Range   Cholesterol 124 0 - 200 mg/dL   Triglycerides 191129 <478<150 mg/dL   HDL 35 (L) >29>40 mg/dL   Total CHOL/HDL Ratio 3.5 RATIO   VLDL 26 0 - 40 mg/dL   LDL Cholesterol 63 0 - 99 mg/dL    Comment:        Total Cholesterol/HDL:CHD Risk Coronary Heart Disease Risk Table                     Men   Women  1/2 Average Risk   3.4   3.3  Average Risk       5.0   4.4  2 X Average Risk   9.6   7.1  3 X Average Risk  23.4   11.0        Use the calculated Patient Ratio above and the CHD Risk Table to determine the patient's  CHD Risk.        ATP III CLASSIFICATION (LDL):  <100     mg/dL   Optimal  [id number]  mg/dL   Near or Above                    Optimal  130-159  mg/dL   Borderline  [id number]  mg/dL  High  >190     mg/dL   Very High Performed at [facility], [address] [place], [place] [address]   TSH     Status: None   Collection Time: 09/12/17  7:08 AM  Result Value Ref Range   TSH 2.197 0.350 - 4.500 uIU/mL    Comment: Performed by a 3rd Generation assay with a functional sensitivity of <=0.01 uIU/mL. Performed at [facility], [address] [place], [place] [address]     Blood Alcohol level:  Lab Results  Component Value Date   [facility] <10 09/10/2017    Metabolic Disorder Labs:  Lab Results  Component Value Date   HGBA1C 4.5 (L) 09/12/2017   MPG 82.45 09/12/2017   No results found for: PROLACTIN Lab Results  Component Value Date   CHOL 124 09/12/2017   TRIG 129 09/12/2017   HDL 35 (L) 09/12/2017   CHOLHDL 3.5 09/12/2017   VLDL 26 09/12/2017   LDLCALC 63 09/12/2017    Current Medications: Current Facility-Administered Medications  Medication Dose Route Frequency Provider Last Rate Last Dose  . acetaminophen (TYLENOL) tablet 650 mg  650 mg Oral Q6H PRN Clapacs, John T, MD      . alum & mag hydroxide-simeth (MAALOX/MYLANTA) 200-200-20 MG/5ML suspension 30 mL  30 mL Oral Q4H PRN Clapacs, John T, MD      . divalproex (DEPAKOTE) DR tablet 250 mg  250 mg Oral Q12H Shyann Hefner B, MD      . lamoTRIgine (LAMICTAL) tablet 150 mg  150 mg Oral BID Clapacs, Jackquline Denmark, MD   150 mg at 09/12/17 0903  . magnesium hydroxide (MILK OF MAGNESIA) suspension 30 mL  30 mL Oral Daily PRN Clapacs, John T, MD      . mirtazapine (REMERON) tablet 15 mg  15 mg Oral QHS Clapacs, John T, MD      . traZODone (DESYREL) tablet 100 mg  100 mg Oral QHS PRN Clapacs, Jackquline Denmark, MD       PTA Medications: Medications Prior to Admission  Medication Sig Dispense Refill Last Dose  . etonogestrel  (NEXPLANON) 68 MG IMPL implant 1 each by Subdermal route once.   09/11/2017 at Unknown time  . ketorolac (TORADOL) 10 MG tablet Take 10 mg by mouth every 6 (six) hours as needed.   09/10/2017 at Unknown time  . lamoTRIgine (LAMICTAL) 150 MG tablet Take 1 tablet (150 mg total) by mouth 2 (two) times daily. 60 tablet 1 09/11/2017 at Unknown time  . divalproex (DEPAKOTE) 250 MG DR tablet Take 1 tablet (250 mg total) by mouth 3 (three) times daily. 90 tablet 0   . lacosamide (VIMPAT) 50 MG TABS tablet Take 1 tablet (50 mg total) by mouth 2 (two) times daily. (Patient not taking: Reported on 09/11/2017) 60 tablet 1 Not Taking at Unknown time    Musculoskeletal: Strength & Muscle Tone: within normal limits Gait & Station: normal Patient leans: N/A  Psychiatric Specialty Exam: Physical Exam  Nursing note and vitals reviewed. Constitutional: She is oriented to person, place, and time. She appears well-developed and well-nourished.  HENT:  Head: Normocephalic and atraumatic.  Eyes: Conjunctivae and EOM are normal. Pupils are equal, round, and reactive to light.  Neck: Normal range of motion. Neck supple.  Cardiovascular: Normal rate, regular rhythm and normal heart sounds.  Respiratory: Effort normal and breath sounds normal.  GI: Soft. Bowel sounds are normal.  Musculoskeletal: Normal range of motion.  Neurological: She is alert and oriented  to person, place, and time.  Skin: Skin is warm and dry.  Psychiatric: She has a normal mood and affect. Her speech is normal and behavior is normal. Thought content normal. Cognition and memory are normal. She expresses impulsivity.    Review of Systems  Neurological: Positive for seizures.  Psychiatric/Behavioral: Positive for depression.  All other systems reviewed and are negative.   Blood pressure (!) 90/55, pulse 74, temperature 97.9 F (36.6 C), temperature source Oral, resp. rate 18, height 5\' 2"  (1.575 m), weight 51.7 kg (114 lb), last menstrual  period 09/07/2017, SpO2 100 %.Body mass index is 20.85 kg/m.  See SRA                                                  Sleep:  Number of Hours: 6    Treatment Plan Summary: Daily contact with patient to assess and evaluate symptoms and progress in treatment and Medication management   Ms. Shirkey is a 27 year old female with no past psychiatric history admitted after overdose in the context of relationship problems.  #Suicidal ideation -patient adamantly denies any thoughts intention or plans to hurt herself or others -able to contract for safety  #Mood -continue Remeron 15 mg nightly  #Seizure disorder -continue Depakote 250 mg BID and Lamictal 150 mg BID as prescribed by Dr. Malvin Johns  #Pregnancy test pending  #Disposition -discharge to home with family -follow up with [facility] -needs medication management    Observation Level/Precautions:  15 minute checks  Laboratory:  CBC Chemistry Profile HCG UDS UA  Psychotherapy:    Medications:    Consultations:    Discharge Concerns:    Estimated LOS:  Other:     Physician Treatment Plan for Primary Diagnosis: Severe major depression, single episode, without psychotic features (HCC) Long Term Goal(s): Improvement in symptoms so as ready for discharge  Short Term Goals: Ability to identify changes in lifestyle to reduce recurrence of condition will improve, Ability to verbalize feelings will improve, Ability to disclose and discuss suicidal ideas, Ability to demonstrate self-control will improve, Ability to identify and develop effective coping behaviors will improve, Ability to maintain clinical measurements within normal limits will improve and Ability to identify triggers associated with substance abuse/mental health issues will improve  Physician Treatment Plan for Secondary Diagnosis: Principal Problem:   Severe major depression, single episode, without psychotic features (HCC) Active Problems:   Suicide  attempt (HCC)   Epilepsy (HCC)  Long Term Goal(s): NA  Short Term Goals: NA  I certify that inpatient services furnished can reasonably be expected to improve the patient's condition.    Kristine Linea, MD 2/21/20193:18 PM

## 2017-09-12 NOTE — [facility] Group Notes (Signed)
09/12/2017  Time: 1:00PM  Type of Therapy/Topic:  Group Therapy:  Balance in Life  Participation Level:  Active  Description of Group:   This group will address the concept of balance and how it feels and looks when one is unbalanced. Patients will be encouraged to process areas in their lives that are out of balance and identify reasons for remaining unbalanced. Facilitators will guide patients in utilizing problem-solving interventions to address and correct the stressor making their life unbalanced. Understanding and applying boundaries will be explored and addressed for obtaining and maintaining a balanced life. Patients will be encouraged to explore ways to assertively make their unbalanced needs known to significant others in their lives, using other group members and facilitator for support and feedback.  Therapeutic Goals: 1. Patient will identify two or more emotions or situations they have that consume much of in their lives. 2. Patient will identify signs/triggers that life has become out of balance:  3. Patient will identify two ways to set boundaries in order to achieve balance in their lives:  4. Patient will demonstrate ability to communicate their needs through discussion and/or role plays  Summary of Patient Progress: Pt continues to work towards their tx goals but has not yet reached them. Pt was able to appropriately participate in group discussion, and was able to offer support/validation to other group members. Pt reported she is feeling, "Hopeful today. I'm hoping I get to go home soon." Pt reported two areas of her life she devotes the most attention to are, "family and money." Pt reported one way she can achieve a more balanced life is to, "worry more about myself than other people."  Therapeutic Modalities:   Cognitive Behavioral Therapy Solution-Focused Therapy Assertiveness Training  Heidi DachKelsey Linard Daft, MSW, LCSW 09/12/2017 1:48 PM

## 2017-09-13 NOTE — Tx Team (Signed)
Interdisciplinary Treatment and Diagnostic Plan Update  09/13/2017 Time of Session: 1030 Sheila Hill MRN: [id number]  Principal Diagnosis: Severe major depression, single episode, without psychotic features (HCC)  Secondary Diagnoses: Principal Problem:   Severe major depression, single episode, without psychotic features (HCC) Active Problems:   Suicide attempt (HCC)   Epilepsy (HCC)   Current Medications:  Current Facility-Administered Medications  Medication Dose Route Frequency Provider Last Rate Last Dose  . acetaminophen (TYLENOL) tablet 650 mg  650 mg Oral Q6H PRN Clapacs, John T, MD      . alum & mag hydroxide-simeth (MAALOX/MYLANTA) 200-200-20 MG/5ML suspension 30 mL  30 mL Oral Q4H PRN Clapacs, John T, MD      . divalproex (DEPAKOTE) DR tablet 250 mg  250 mg Oral Q12H Pucilowska, Jolanta B, MD   250 mg at 09/13/17 0824  . lamoTRIgine (LAMICTAL) tablet 150 mg  150 mg Oral BID Clapacs, Jackquline DenmarkJohn T, MD   150 mg at 09/13/17 [id number]  . magnesium hydroxide (MILK OF MAGNESIA) suspension 30 mL  30 mL Oral Daily PRN Clapacs, John T, MD      . mirtazapine (REMERON) tablet 15 mg  15 mg Oral QHS Clapacs, Jackquline DenmarkJohn T, MD   15 mg at 09/12/17 2104  . traZODone (DESYREL) tablet 100 mg  100 mg Oral QHS PRN Clapacs, Jackquline DenmarkJohn T, MD   100 mg at 09/12/17 2104   PTA Medications: Medications Prior to Admission  Medication Sig Dispense Refill Last Dose  . etonogestrel (NEXPLANON) 68 MG IMPL implant 1 each by Subdermal route once.   09/11/2017 at Unknown time  . ketorolac (TORADOL) 10 MG tablet Take 10 mg by mouth every 6 (six) hours as needed.   09/10/2017 at Unknown time  . lacosamide (VIMPAT) 50 MG TABS tablet Take 1 tablet (50 mg total) by mouth 2 (two) times daily. (Patient not taking: Reported on 09/11/2017) 60 tablet 1 Not Taking at Unknown time  . [DISCONTINUED] divalproex (DEPAKOTE) 250 MG DR tablet Take 1 tablet (250 mg total) by mouth 3 (three) times daily. 90 tablet 0     Patient Stressors:  Financial difficulties Health problems Marital or family conflict  Patient Strengths: Ability for insight Active sense of humor Average or above average intelligence Capable of independent living Communication skills Motivation for treatment/growth Work skills  Treatment Modalities: Medication Management, Group therapy, Case management,  1 to 1 session with clinician, Psychoeducation, Recreational therapy.   Physician Treatment Plan for Primary Diagnosis: Severe major depression, single episode, without psychotic features (HCC) Long Term Goal(s): Improvement in symptoms so as ready for discharge NA   Short Term Goals: Ability to identify changes in lifestyle to reduce recurrence of condition will improve Ability to verbalize feelings will improve Ability to disclose and discuss suicidal ideas Ability to demonstrate self-control will improve Ability to identify and develop effective coping behaviors will improve Ability to maintain clinical measurements within normal limits will improve Ability to identify triggers associated with substance abuse/mental health issues will improve NA  Medication Management: Evaluate patient's response, side effects, and tolerance of medication regimen.  Therapeutic Interventions: 1 to 1 sessions, Unit Group sessions and Medication administration.  Evaluation of Outcomes: Adequate for Discharge  Physician Treatment Plan for Secondary Diagnosis: Principal Problem:   Severe major depression, single episode, without psychotic features (HCC) Active Problems:   Suicide attempt (HCC)   Epilepsy (HCC)  Long Term Goal(s): Improvement in symptoms so as ready for discharge NA   Short Term Goals: Ability to identify  changes in lifestyle to reduce recurrence of condition will improve Ability to verbalize feelings will improve Ability to disclose and discuss suicidal ideas Ability to demonstrate self-control will improve Ability to identify and  develop effective coping behaviors will improve Ability to maintain clinical measurements within normal limits will improve Ability to identify triggers associated with substance abuse/mental health issues will improve NA     Medication Management: Evaluate patient's response, side effects, and tolerance of medication regimen.  Therapeutic Interventions: 1 to 1 sessions, Unit Group sessions and Medication administration.  Evaluation of Outcomes: Adequate for Discharge   RN Treatment Plan for Primary Diagnosis: Severe major depression, single episode, without psychotic features (HCC) Long Term Goal(s): Knowledge of disease and therapeutic regimen to maintain health will improve  Short Term Goals: Ability to participate in decision making will improve, Ability to identify and develop effective coping behaviors will improve and Compliance with prescribed medications will improve  Medication Management: RN will administer medications as ordered by provider, will assess and evaluate patient's response and provide education to patient for prescribed medication. RN will report any adverse and/or side effects to prescribing provider.  Therapeutic Interventions: 1 on 1 counseling sessions, Psychoeducation, Medication administration, Evaluate responses to treatment, Monitor vital signs and CBGs as ordered, Perform/monitor CIWA, COWS, AIMS and Fall Risk screenings as ordered, Perform wound care treatments as ordered.  Evaluation of Outcomes: Adequate for Discharge   LCSW Treatment Plan for Primary Diagnosis: Severe major depression, single episode, without psychotic features (HCC) Long Term Goal(s): Safe transition to appropriate next level of care at discharge, Engage patient in therapeutic group addressing interpersonal concerns.  Short Term Goals: Engage patient in aftercare planning with referrals and resources, Identify triggers associated with mental health/substance abuse issues and Increase  skills for wellness and recovery  Therapeutic Interventions: Assess for all discharge needs, 1 to 1 time with Social worker, Explore available resources and support systems, Assess for adequacy in community support network, Educate family and significant other(s) on suicide prevention, Complete Psychosocial Assessment, Interpersonal group therapy.  Evaluation of Outcomes: Adequate for Discharge   Progress in Treatment: Attending groups: Yes. Participating in groups: Yes. Taking medication as prescribed: Yes. Toleration medication: Yes. Family/Significant other contact made: Yes, individual(s) contacted:  pt's father. Patient understands diagnosis: Yes. Discussing patient identified problems/goals with staff: Yes. Medical problems stabilized or resolved: Yes. Denies suicidal/homicidal ideation: Yes. Issues/concerns per patient self-inventory: No. Other: None at this time.   New problem(s) identified: No, Describe:  none at this time.   New Short Term/Long Term Goal(s): Pt reported her goal for treatment is to, "go home and make a better life for my myself and my children."   Discharge Plan or Barriers: Pt will discharge home and will continue treatment in the outpatient setting with [facility].  Reason for Continuation of Hospitalization: Depression Medication stabilization  Estimated Length of Stay: Pt will discharge today.   Attendees: Patient: Sheila Hill 09/13/2017 11:07 AM  Physician: Dr. Jennet Maduro, MD 09/13/2017 11:07 AM  Nursing:  09/13/2017 11:07 AM  RN Care Manager: 09/13/2017 11:07 AM  Social Worker: Heidi Dach, LCSW 09/13/2017 11:07 AM  Recreational Therapist:  09/13/2017 11:07 AM  Other: Johny Shears, LCSWA 09/13/2017 11:07 AM  Other:  09/13/2017 11:07 AM  Other: 09/13/2017 11:07 AM    Scribe for Treatment Team: Heidi Dach, LCSW 09/13/2017 11:07 AM

## 2017-09-13 NOTE — Progress Notes (Signed)
Received Para [name] this am in her room,she was encouraged and escorted to the dinning room for breakfast. She was compliant with her medications. She received her discharged order. The AVS was reviewed and her questions answered. She received her medications and her personal belongings returned. She was discharged with her family without incident.

## 2017-09-13 NOTE — [facility] Group Notes (Signed)
LCSW Group Therapy Note  09/13/2017 1:00 pm  Type of Therapy and Topic:  Group Therapy:  Feelings around Relapse and Recovery  Participation Level:  Did Not Attend   Description of Group:    Patients in this group will discuss emotions they experience before and after a relapse. They will process how experiencing these feelings, or avoidance of experiencing them, relates to having a relapse. Facilitator will guide patients to explore emotions they have related to recovery. Patients will be encouraged to process which emotions are more powerful. They will be guided to discuss the emotional reaction significant others in their lives may have to their relapse or recovery. Patients will be assisted in exploring ways to respond to the emotions of others without this contributing to a relapse.  Therapeutic Goals: 1. Patient will identify two or more emotions that lead to a relapse for them 2. Patient will identify two emotions that result when they relapse 3. Patient will identify two emotions related to recovery 4. Patient will demonstrate ability to communicate their needs through discussion and/or role plays   Summary of Patient Progress:     Therapeutic Modalities:   Cognitive Behavioral Therapy Solution-Focused Therapy Assertiveness Training Relapse Prevention Therapy   Sheila FrameSonya S Jayme Cham, LCSW 09/13/2017 2:31 PM

## 2017-09-13 NOTE — [facility] Suicide Risk Assessment (Signed)
[facility]   Principal Problem: Severe major depression, single episode, without psychotic features [facility](HCC) Discharge Diagnoses:  Patient Active Problem List   Diagnosis Date Noted  . Severe major depression, single episode, without psychotic features (HCC) [F32.2] 09/11/2017    Priority: High  . Suicide attempt (HCC) [T14.91XA] 09/11/2017  . Epilepsy (HCC) [G40.909] 09/11/2017    Total Time spent with patient: 35  Musculoskeletal: Strength & Muscle Tone: within normal limits Gait & Station: normal Patient leans: N/A  Psychiatric Specialty Exam: Review of Systems  Neurological: Negative.   All other systems reviewed and are negative.   Blood pressure 118/85, pulse 71, temperature 98.2 F (36.8 C), temperature source Oral, resp. rate 18, height 5\' 2"  (1.575 m), weight 51.7 kg (114 lb), last menstrual period 09/07/2017, SpO2 100 %.Body mass index is 20.85 kg/m.  General Appearance: Casual  Eye Contact::  Good  Speech:  Clear and Coherent409  Volume:  Normal  Mood:  Euthymic  Affect:  Appropriate  Thought Process:  Goal Directed and Descriptions of Associations: Intact  Orientation:  Full (Time, Place, and Person)  Thought Content:  WDL  Suicidal Thoughts:  No  Homicidal Thoughts:  No  Memory:  Immediate;   Fair Recent;   Fair Remote;   Fair  Judgement:  Impaired  Insight:  Present  Psychomotor Activity:  Normal  Concentration:  Fair  Recall:  [employer]Fair  Fund of Knowledge:Fair  Language: Fair  Akathisia:  No  Handed:  Right  AIMS (if indicated):     Assets:  Communication Skills Desire for Improvement Financial Resources/Insurance Housing Physical Health Resilience Social Support Vocational/Educational  Sleep:  Number of Hours: 8  Cognition: WNL  ADL's:  Intact   Mental Status Per Nursing Assessment::   On Admission:  NA  Demographic Factors:  Adolescent or young adult and Divorced or widowed  Loss Factors: Loss of significant  relationship  Historical Factors: Impulsivity  Risk Reduction Factors:   Responsible for children under 28 years of age, Sense of responsibility to family, Employed, Living with another person, especially a relative and Positive social support  Continued Clinical Symptoms:  Depression:   Impulsivity  Cognitive Features That Contribute To Risk:  None    Suicide Risk:  Minimal: No identifiable suicidal ideation.  Patients presenting with no risk factors but with morbid ruminations; may be classified as minimal risk based on the severity of the depressive symptoms    Plan Of Care/Follow-up recommendations:  Activity:  as tolerated Diet:  regular Other:  keep follow up appointments  Kristine LineaJolanta Abril Cappiello, MD 09/13/2017, 7:39 AM

## 2017-09-13 NOTE — Discharge Summary (Signed)
Physician Discharge Summary Note  Patient:  Sheila Hill is an 27 y.o., female MRN:  [id number] DOB:  1990-07-30 Patient phone:  [phone] (home)  Patient address:   [address] [place] [address],  Total Time spent with patient: 46  Date of Admission:  09/11/2017 Date of Discharge: 09/13/2017  Reason for Admission:  Overdose  History of Present Illness:   Identifying data. Sheila Hill is a 27 year old female with a history of mild depression.  Chief complaint. "I felt that he is holding me back."  History of present illness. Information was obtained from the patient and the chart. The patient has been somewhat depressed since she separated from her partner of 11 years in September of 2018. The conflict is still unresolved and the patient felt that she has not been able to deal with the stress any longer. She took overdose of Tramadol but luckily she texted her friends. She was asleep when the family found her. She is glad to be alive and apologetic about her act. It was impulsive, she did not plan it. She reports anhedonia, crying spells, social isolation, feeling of helplessness. She denies excessive anxiety or psychosis. No substances are involved.   Past psychiatric history. None reported.  Family psychiatric history. None reported.  Social history. Since separation, she lives with her parents and her 3 children ages 56, 82, and 87. She works as a Sales executive. She is about to lose her Medicaid this month. She wants to go to school to be a Armed forces operational officer.  Principal Problem: Severe major depression, single episode, without psychotic features [facility]) Discharge Diagnoses: Patient Active Problem List   Diagnosis Date Noted  . Severe major depression, single episode, without psychotic features (HCC) [F32.2] 09/11/2017    Priority: High  . Suicide attempt (HCC) [T14.91XA] 09/11/2017  . Epilepsy (HCC) [G40.909] 09/11/2017   Past Medical History:  Past Medical  History:  Diagnosis Date  . Chronic back pain   . Scoliosis   . Seizure [facility])     Past Surgical History:  Procedure Laterality Date  . CESAREAN SECTION     Family History: History reviewed. No pertinent family history.  Social History:  Social History   Substance and Sexual Activity  Alcohol Use No     Social History   Substance and Sexual Activity  Drug Use No    Social History   Socioeconomic History  . Marital status: Single    Spouse name: None  . Number of children: None  . Years of education: None  . Highest education level: None  Social Needs  . Financial resource strain: None  . Food insecurity - worry: None  . Food insecurity - inability: None  . Transportation needs - medical: None  . Transportation needs - non-medical: None  Occupational History  . None  Tobacco Use  . Smoking status: Never Smoker  . Smokeless tobacco: Never Used  Substance and Sexual Activity  . Alcohol use: No  . Drug use: No  . Sexual activity: Not Currently    Birth control/protection: Implant  Other Topics Concern  . None  Social History Narrative  . None    Hospital Course:    Sheila Hill is a 27 year old female with no past psychiatric history admitted after overdose in the context of relationship problems. Suicidal ideation has resolved. The patient adamantly denies any thoughts intention or plans to hurt herself or others. She is able to contract for safety. She is forward thinking and optimistic about  the future. She is a loving mother of 3 children.  #Mood, improved -continue Remeron 15 mg nightly  #Seizure disorder -continue Depakote 250 mg TID and Lamictal 150 mg BID as prescribed by Dr. Malvin JohnsPotter  #Pregnancy test negative  #Disposition -discharge to home with family -follow up with [facility] for medication management -referral to our free pharmacy completed -follow up with Dr. Malvin JohnsPotter for treatment of seizures   Physical Findings: AIMS:  , ,  ,  ,    CIWA:     COWS:     Musculoskeletal: Strength & Muscle Tone: within normal limits Gait & Station: normal Patient leans: N/A  Psychiatric Specialty Exam: Physical Exam  Nursing note and vitals reviewed. Psychiatric: She has a normal mood and affect. Her speech is normal and behavior is normal. Thought content normal. Cognition and memory are normal. She expresses impulsivity.    Review of Systems  Neurological: Negative.   Psychiatric/Behavioral: Negative.   All other systems reviewed and are negative.   Blood pressure 118/85, pulse 71, temperature 98.2 F (36.8 C), temperature source Oral, resp. rate 18, height 5\' 2"  (1.575 m), weight 51.7 kg (114 lb), last menstrual period 09/07/2017, SpO2 100 %.Body mass index is 20.85 kg/m.  General Appearance: Casual  Eye Contact:  Good  Speech:  Clear and Coherent  Volume:  Normal  Mood:  Euthymic  Affect:  Appropriate  Thought Process:  Goal Directed and Descriptions of Associations: Intact  Orientation:  Full (Time, Place, and Person)  Thought Content:  WDL  Suicidal Thoughts:  No  Homicidal Thoughts:  No  Memory:  Immediate;   Fair Recent;   Fair Remote;   Fair  Judgement:  Impaired  Insight:  Present  Psychomotor Activity:  Normal  Concentration:  Concentration: Fair and Attention Span: Fair  Recall:  FiservFair  Fund of Knowledge:  Fair  Language:  Fair  Akathisia:  No  Handed:  Right  AIMS (if indicated):     Assets:  Communication Skills Desire for Improvement Financial Resources/Insurance Housing Physical Health Resilience Social Support Vocational/Educational  ADL's:  Intact  Cognition:  WNL  Sleep:  Number of Hours: 8     Have you used any form of tobacco in the last 30 days? (Cigarettes, Smokeless Tobacco, Cigars, and/or Pipes): No  Has this patient used any form of tobacco in the last 30 days? (Cigarettes, Smokeless Tobacco, Cigars, and/or Pipes) Yes, No  Blood Alcohol level:  Lab Results  Component Value Date   ETH  <10 09/10/2017    Metabolic Disorder Labs:  Lab Results  Component Value Date   HGBA1C 4.5 (L) 09/12/2017   MPG 82.45 09/12/2017   No results found for: PROLACTIN Lab Results  Component Value Date   CHOL 124 09/12/2017   TRIG 129 09/12/2017   HDL 35 (L) 09/12/2017   CHOLHDL 3.5 09/12/2017   VLDL 26 09/12/2017   LDLCALC 63 09/12/2017    See Psychiatric Specialty Exam and Suicide Risk Assessment completed by Attending Physician prior to discharge.  Discharge destination:  Home  Is patient on multiple antipsychotic therapies at discharge:  No   Has Patient had three or more failed trials of antipsychotic monotherapy by history:  No  Recommended Plan for Multiple Antipsychotic Therapies: NA  Discharge Instructions    Diet - low sodium heart healthy   Complete by:  As directed    Increase activity slowly   Complete by:  As directed      Allergies as of 09/13/2017  No Known Allergies     Medication List    STOP taking these medications   ketorolac 10 MG tablet Commonly known as:  TORADOL   lacosamide 50 MG Tabs tablet Commonly known as:  VIMPAT     TAKE these medications     Indication  divalproex 250 MG DR tablet Commonly known as:  DEPAKOTE Take 1 tablet (250 mg total) by mouth 3 (three) times daily.  Indication:  Multiple Seizure Types   etonogestrel 68 MG Impl implant Commonly known as:  NEXPLANON 1 each by Subdermal route once.  Indication:  Birth Control Treatment   lamoTRIgine 150 MG tablet Commonly known as:  LAMICTAL Take 1 tablet (150 mg total) by mouth 2 (two) times daily.  Indication:  Epilepsy   mirtazapine 15 MG tablet Commonly known as:  REMERON Take 1 tablet (15 mg total) by mouth at bedtime.  Indication:  Major Depressive Disorder        Follow-up recommendations:  Activity:  as tolerated Diet:  regular Other:  keep follow up appointments  Comments:    Signed: Kristine Linea, MD 09/13/2017, 7:42 AM

## 2017-09-13 NOTE — Progress Notes (Signed)
  [facility] :  Will you be returning to the same living situation after discharge:  Yes,  returning home. At discharge, do you have transportation home?: Yes,  pt's parents. Do you have the ability to pay for your medications: Yes,  money from employment.  Release of information consent forms completed and in the chart;  Patient's signature needed at discharge.  Patient to Follow up at: Follow-up Information    [facility] Go on 09/20/2017.   Why:  Please meet Unk PintoHarvey Bryant, peer support specialist, [facility] Friday, 09/20/17 at 7:15AM for Peer Support Services. Thank you.  Contact information: [address] [place] [id number] [phone]           Next level of care provider [facility] Link:no  Safety Planning and Suicide Prevention discussed: Yes,  with pt and her father.  Have you used any form of tobacco in the last 30 days? (Cigarettes, Smokeless Tobacco, Cigars, and/or Pipes): No  Has patient been referred to the Quitline?: N/A patient is not a smoker  Patient has been referred for addiction treatment: N/A  Heidi DachKelsey Harmon Bommarito, LCSW 09/13/2017, 9:38 AM

## 2018-07-07 ENCOUNTER — Other Ambulatory Visit: Payer: Self-pay

## 2018-07-07 ENCOUNTER — Observation Stay
Admission: EM | Admit: 2018-07-07 | Discharge: 2018-07-08 | Disposition: A | Discharge status: Home / Self Care | Payer: Medicaid Other | Attending: Internal Medicine | Admitting: Internal Medicine

## 2018-07-07 DIAGNOSIS — Z79899 Other long term (current) drug therapy: Secondary | ICD-10-CM | POA: Diagnosis not present

## 2018-07-07 DIAGNOSIS — R569 Unspecified convulsions: Principal | ICD-10-CM

## 2018-07-07 LAB — CBC WITH DIFFERENTIAL/PLATELET
Abs Immature Granulocytes: 0.02 10*3/uL (ref 0.00–0.07)
BASOS ABS: 0.1 10*3/uL (ref 0.0–0.1)
Basophils Relative: 1 %
EOS PCT: 2 %
Eosinophils Absolute: 0.2 10*3/uL (ref 0.0–0.5)
HEMATOCRIT: 37.8 % (ref 36.0–46.0)
HEMOGLOBIN: 12.8 g/dL (ref 12.0–15.0)
Immature Granulocytes: 0 %
LYMPHS PCT: 38 %
Lymphs Abs: 2.5 10*3/uL (ref 0.7–4.0)
MCH: 33 pg (ref 26.0–34.0)
MCHC: 33.9 g/dL (ref 30.0–36.0)
MCV: 97.4 fL (ref 80.0–100.0)
Monocytes Absolute: 0.5 10*3/uL (ref 0.1–1.0)
Monocytes Relative: 8 %
NRBC: 0 % (ref 0.0–0.2)
Neutro Abs: 3.3 10*3/uL (ref 1.7–7.7)
Neutrophils Relative %: 51 %
Platelets: 171 10*3/uL (ref 150–400)
RBC: 3.88 MIL/uL (ref 3.87–5.11)
RDW: 11.4 % — ABNORMAL LOW (ref 11.5–15.5)
WBC: 6.6 10*3/uL (ref 4.0–10.5)

## 2018-07-07 LAB — BASIC METABOLIC PANEL
Anion gap: 11 (ref 5–15)
BUN: 12 mg/dL (ref 6–20)
CALCIUM: 8.4 mg/dL — AB (ref 8.9–10.3)
CHLORIDE: 102 mmol/L (ref 98–111)
CO2: 21 mmol/L — ABNORMAL LOW (ref 22–32)
CREATININE: 0.84 mg/dL (ref 0.44–1.00)
GFR calc non Af Amer: >60 mL/min (ref >60)
Glucose, Bld: 86 mg/dL (ref 70–99)
Potassium: 3.7 mmol/L (ref 3.5–5.1)
SODIUM: 134 mmol/L — AB (ref 135–145)

## 2018-07-07 LAB — POCT PREGNANCY, URINE: Preg Test, Ur: NEGATIVE

## 2018-07-07 LAB — VALPROIC ACID LEVEL: VALPROIC ACID LVL: 71 ug/mL (ref 50.0–100.0)

## 2018-07-07 MED ORDER — SODIUM CHLORIDE 0.9 % IV BOLUS
1000.0000 mL | Freq: Once | INTRAVENOUS | Status: AC
  Administered 2018-07-07: 15:00:00 1000 mL via INTRAVENOUS

## 2018-07-07 MED ORDER — ACETAMINOPHEN 650 MG RE SUPP: 650.0000 mg | Freq: Four times a day (QID) | RECTAL | Status: DC | PRN

## 2018-07-07 MED ORDER — ONDANSETRON HCL 4 MG/2ML IJ SOLN: 4.0000 mg | Freq: Four times a day (QID) | INTRAMUSCULAR | Status: DC | PRN

## 2018-07-07 MED ORDER — SODIUM CHLORIDE 0.9 % IV SOLN
INTRAVENOUS | Status: DC
  Administered 2018-07-07 – 2018-07-08 (×2): via INTRAVENOUS

## 2018-07-07 MED ORDER — LEVETIRACETAM IN NACL 1000 MG/100ML IV SOLN
1000.0000 mg | Freq: Once | INTRAVENOUS | Status: AC
  Administered 2018-07-07: 1000 mg via INTRAVENOUS
  Filled 2018-07-07: qty 100

## 2018-07-07 MED ORDER — LAMOTRIGINE 100 MG PO TABS
150.0000 mg | ORAL_TABLET | Freq: Two times a day (BID) | ORAL | Status: DC
  Administered 2018-07-07 – 2018-07-08 (×2): 150 mg via ORAL
  Filled 2018-07-07 (×2): qty 2

## 2018-07-07 MED ORDER — ACETAMINOPHEN 325 MG PO TABS
650.0000 mg | ORAL_TABLET | Freq: Four times a day (QID) | ORAL | Status: DC | PRN
  Administered 2018-07-07: 650 mg via ORAL
  Filled 2018-07-07: qty 2

## 2018-07-07 MED ORDER — SODIUM CHLORIDE 0.9 % IV BOLUS
500.0000 mL | Freq: Once | INTRAVENOUS | Status: AC
Start: 2018-07-07 — End: 2018-07-07
  Administered 2018-07-07: 500 mL via INTRAVENOUS

## 2018-07-07 MED ORDER — ENOXAPARIN SODIUM 40 MG/0.4ML ~~LOC~~ SOLN
40.0000 mg | SUBCUTANEOUS | Status: DC
  Administered 2018-07-07: 40 mg via SUBCUTANEOUS
  Filled 2018-07-07: qty 0.4

## 2018-07-07 MED ORDER — ONDANSETRON HCL 4 MG PO TABS: 4.0000 mg | ORAL_TABLET | Freq: Four times a day (QID) | ORAL | Status: DC | PRN

## 2018-07-07 MED ORDER — DIVALPROEX SODIUM 250 MG PO DR TAB
250.0000 mg | DELAYED_RELEASE_TABLET | Freq: Three times a day (TID) | ORAL | Status: DC
  Administered 2018-07-07 – 2018-07-08 (×2): 250 mg via ORAL
  Filled 2018-07-07 (×5): qty 1

## 2018-07-07 MED ORDER — LORAZEPAM 2 MG/ML IJ SOLN
2.0000 mg | Freq: Once | INTRAMUSCULAR | Status: AC
  Administered 2018-07-07: 2 mg via INTRAVENOUS

## 2018-07-07 MED ORDER — SENNOSIDES-DOCUSATE SODIUM 8.6-50 MG PO TABS: 1.0000 | ORAL_TABLET | Freq: Every evening | ORAL | Status: DC | PRN

## 2018-07-07 MED ORDER — LORAZEPAM 2 MG/ML IJ SOLN: 2.0000 mg | INTRAMUSCULAR | Status: DC | PRN

## 2018-07-07 NOTE — ED Notes (Addendum)
Pt offered something to drink, denies wanting to drink or eat anything.

## 2018-07-07 NOTE — ED Notes (Signed)
Pts family yelling out from room, this RN entered with medic and pt was having tonic clonic seizure movements. MD was notified and came to bed side. IV ativan ordered. Seizure pads in place and suction set up with ambu bag readily available. Will cont to monitor.

## 2018-07-07 NOTE — ED Notes (Signed)
Report to Ashley, RN

## 2018-07-07 NOTE — ED Notes (Signed)
Small hematoma to posterior head sustained when she fell and had seizure.

## 2018-07-07 NOTE — ED Notes (Addendum)
Attempted to call report, RN unavailable at this time.

## 2018-07-07 NOTE — Progress Notes (Signed)
Chaplain responded to a page for support. Family expressed no spiritual needs   07/07/18 1200  Clinical Encounter Type  Visited With Patient and family together  Visit Type Initial;Spiritual support  Spiritual Encounters  Spiritual Needs Emotional

## 2018-07-07 NOTE — ED Notes (Signed)
Hospitalist to bedside at this time 

## 2018-07-07 NOTE — H&P (Signed)
[facility] - [place] at [facility]   PATIENT NAME: Sheila Hill    MR#:  [id number]  DATE OF BIRTH:  1990/12/04  DATE OF ADMISSION:  07/07/2018  PRIMARY CARE PHYSICIAN: [facility]   REQUESTING/REFERRING PHYSICIAN:   CHIEF COMPLAINT:   Chief Complaint  Patient presents with  . Seizures    HISTORY OF PRESENT ILLNESS: Sheila Hill  is a 27 y.o. female with a known history of seizure disorder, scoliosis, chronic back pain presented to the emergency room for seizures.  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  She had seizure today this morning at work.  After arrival in the emergency room she also had a grand mal seizure.  Patient takes Depakote and Lamictal for seizures.  Patient received IV Ativan in the emergency room and currently lethargic sleepy and postictal state.  Most of the history obtained from family at bedside.  According to family members patient has been compliant with the medication.  And since the first time after last couple of years patient had 2 seizures today.  PAST MEDICAL HISTORY:   Past Medical History:  Diagnosis Date  . Chronic back pain   . Scoliosis   . Seizure (HCC)     PAST SURGICAL HISTORY:  Past Surgical History:  Procedure Laterality Date  . CESAREAN SECTION      SOCIAL HISTORY:  Social History   Tobacco Use  . Smoking status: Never Smoker  . Smokeless tobacco: Never Used  Substance Use Topics  . Alcohol use: No    FAMILY HISTORY: No history of COPD diabetes cancer in the family Mother and father are alive  DRUG ALLERGIES: No Known Allergies  REVIEW OF SYSTEMS:  Could not be obtained secondary to patient being in postictal state  MEDICATIONS AT HOME:  Prior to Admission medications   Medication Sig Start Date End Date Taking? Authorizing Provider  divalproex (DEPAKOTE) 250 MG DR tablet Take 250 mg by mouth 3 (three) times daily.   Yes [provider]   etonogestrel (NEXPLANON) 68 MG IMPL implant 1 each by Subdermal route once.   Yes [provider]  lamoTRIgine (LAMICTAL) 150 MG tablet Take 1 tablet (150 mg total) by mouth 2 (two) times daily. 09/12/17  Yes Pucilowska, Jolanta B, MD  divalproex (DEPAKOTE) 250 MG DR tablet Take 1 tablet (250 mg total) by mouth 3 (three) times daily. 09/12/17 10/12/17  Pucilowska, Ellin Goodie, MD  mirtazapine (REMERON) 15 MG tablet Take 1 tablet (15 mg total) by mouth at bedtime. Patient not taking: Reported on 07/07/2018 09/13/17   Pucilowska, Ellin Goodie, MD      PHYSICAL EXAMINATION:   VITAL SIGNS: Blood pressure 114/67, pulse (!) 124, temperature 98.7 F (37.1 C), temperature source Oral, resp. rate (!) 22, height 5\' 2"  (1.575 m), weight 61.2 kg, SpO2 97 %.  GENERAL:  27 y.o.-year-old patient lying in the bed with no acute distress.  EYES: Pupils equal, round, reactive to light and accommodation. No scleral icterus. Extraocular muscles intact.  HEENT: Head atraumatic, normocephalic. Oropharynx and nasopharynx clear.  NECK:  Supple, no jugular venous distention. No thyroid enlargement, no tenderness.  LUNGS: Normal breath sounds bilaterally, no wheezing, rales,rhonchi or crepitation. No use of accessory muscles of respiration.  CARDIOVASCULAR: S1, S2 normal. No murmurs, rubs, or gallops.  ABDOMEN: Soft, nontender, nondistended. Bowel sounds present. No organomegaly or mass.  EXTREMITIES: No pedal edema, cyanosis, or clubbing.  NEUROLOGIC: Patient in postictal state Could not  be assessed completely PSYCHIATRIC: Could not be assessed as patient is in postictal state SKIN: No obvious rash, lesion, or ulcer.   LABORATORY PANEL:   CBC Recent Labs  Lab 07/07/18 0904  WBC 6.6  HGB 12.8  HCT 37.8  PLT 171  MCV 97.4  MCH 33.0  MCHC 33.9  RDW 11.4*  LYMPHSABS 2.5  MONOABS 0.5  EOSABS 0.2  BASOSABS 0.1    ------------------------------------------------------------------------------------------------------------------  Chemistries  Recent Labs  Lab 07/07/18 0904  NA 134*  K 3.7  CL 102  CO2 21*  GLUCOSE 86  BUN 12  CREATININE 0.84  CALCIUM 8.4*   ------------------------------------------------------------------------------------------------------------------ estimated creatinine clearance is 85.8 mL/min (by C-G formula based on SCr of 0.84 mg/dL). ------------------------------------------------------------------------------------------------------------------ No results for input(s): TSH, T4TOTAL, T3FREE, THYROIDAB in the last 72 hours.  Invalid input(s): FREET3   Coagulation profile No results for input(s): INR, PROTIME in the last 168 hours. ------------------------------------------------------------------------------------------------------------------- No results for input(s): DDIMER in the last 72 hours. -------------------------------------------------------------------------------------------------------------------  Cardiac Enzymes No results for input(s): CKMB, TROPONINI, MYOGLOBIN in the last 168 hours.  Invalid input(s): CK ------------------------------------------------------------------------------------------------------------------ Invalid input(s): POCBNP  ---------------------------------------------------------------------------------------------------------------  Urinalysis No results found for: COLORURINE, APPEARANCEUR, LABSPEC, PHURINE, GLUCOSEU, HGBUR, BILIRUBINUR, KETONESUR, PROTEINUR, UROBILINOGEN, NITRITE, LEUKOCYTESUR   RADIOLOGY: No results found.  EKG: Orders placed or performed during the hospital encounter of 07/07/18  . ED EKG  . ED EKG    IMPRESSION AND PLAN:  27 year old female patient with history of seizure disorder presented to the emergency room for seizure  -Breakthrough seizure Admit patient to medical for  observation bed Neurology consultation Resume Depakote and Lamictal at home dose IV Keppra 1 g ordered in the emergency room PRN IV Ativan for seizures Check EEG  -Hyponatremia IV fluid hydration with normal saline  -DVT prophylaxis subcu Lovenox daily  All the records are reviewed and case discussed with ED provider. Management plans discussed with the patient, family and they are in agreement.  CODE STATUS:Full code Code Status History    Date Active Date Inactive Code Status Order ID Comments User Context   09/11/2017 2313 09/13/2017 1833 Full Code [id number]  Clapacs, Jackquline DenmarkJohn T, MD Inpatient       TOTAL TIME TAKING CARE OF THIS PATIENT: 53 minutes.    Ihor AustinPavan Pyreddy M.D on 07/07/2018 at 1:10 PM  Between 7am to 6pm - Pager - [phone]  After 6pm go to [email or web address] - password [email or web address] [facility]  [facility]  Office  [phone]  CC: Primary care physician; [facility]

## 2018-07-07 NOTE — ED Provider Notes (Signed)
[facility] Emergency Department Provider Note  ____________________________________________  Time seen: Approximately 11:04 AM  I have reviewed the triage vital signs and the nursing notes.   HISTORY  Chief Complaint Seizures    HPI Sheila Hill is a 27 y.o. female with a history of depression and seizure disorder who was at work when she had a seizure today.  She reports that she has been in her usual state of health although she woke up feeling somewhat dizzy and tired today.  Otherwise no changes in her medicines or new medicines.  Denies any recent trauma fevers chills or neck pain.  Denies dysuria frequency urgency or abnormal vaginal bleeding or discharge.  Symptoms were noted by bystanders to last for 30 seconds, resolve spontaneously.  Patient now feels like she is back to normal.   Patient denies any prodromal symptoms.  No significant headache at this time.     Past Medical History:  Diagnosis Date  . Chronic back pain   . Scoliosis   . Seizure [facility](HCC)      Patient Active Problem List   Diagnosis Date Noted  . Severe major depression, single episode, without psychotic features (HCC) 09/11/2017  . Suicide attempt (HCC) 09/11/2017  . Epilepsy (HCC) 09/11/2017     Past Surgical History:  Procedure Laterality Date  . CESAREAN SECTION       Prior to Admission medications   Medication Sig Start Date End Date Taking? Authorizing Provider  divalproex (DEPAKOTE) 250 MG DR tablet Take 250 mg by mouth 3 (three) times daily.   Yes [provider]  etonogestrel (NEXPLANON) 68 MG IMPL implant 1 each by Subdermal route once.   Yes [provider]  lamoTRIgine (LAMICTAL) 150 MG tablet Take 1 tablet (150 mg total) by mouth 2 (two) times daily. 09/12/17  Yes Pucilowska, Jolanta B, MD  divalproex (DEPAKOTE) 250 MG DR tablet Take 1 tablet (250 mg total) by mouth 3 (three) times daily. 09/12/17 10/12/17  Pucilowska, Ellin GoodieJolanta B, MD   mirtazapine (REMERON) 15 MG tablet Take 1 tablet (15 mg total) by mouth at bedtime. Patient not taking: Reported on 07/07/2018 09/13/17   Shari ProwsPucilowska, Jolanta B, MD     Allergies Patient has no known allergies.   No family history on file.  Social History Social History   Tobacco Use  . Smoking status: Never Smoker  . Smokeless tobacco: Never Used  Substance Use Topics  . Alcohol use: No  . Drug use: No    Review of Systems  Constitutional:   No fever or chills.  ENT:   No sore throat. No rhinorrhea. Cardiovascular:   No chest pain or syncope. Respiratory:   No dyspnea or cough. Gastrointestinal:   Negative for abdominal pain, vomiting and diarrhea.  Musculoskeletal:   Negative for focal pain or swelling All other systems reviewed and are negative except as documented above in ROS and HPI.  ____________________________________________   PHYSICAL EXAM:  VITAL SIGNS: ED Triage Vitals  Enc Vitals Group     BP 07/07/18 0900 (!) 103/55     Pulse Rate 07/07/18 0900 (!) 105     Resp --      Temp 07/07/18 0901 98.7 F (37.1 C)     Temp Source 07/07/18 0901 Oral     SpO2 07/07/18 0900 99 %     Weight 07/07/18 0903 135 lb (61.2 kg)     Height 07/07/18 0903 5\' 2"  (1.575 m)     Head Circumference --  Peak Flow --      Pain Score 07/07/18 0902 0     Pain Loc --      Pain Edu? --      Excl. in GC? --     Vital signs reviewed, nursing assessments reviewed.   Constitutional:   Alert and oriented. Non-toxic appearance. Eyes:   Conjunctivae are normal. EOMI. PERRL. ENT      Head:   Normocephalic with small posterior subcutaneous hematoma.  No laceration.      Nose:   No congestion/rhinnorhea.       Mouth/Throat:   MMM, no pharyngeal erythema. No peritonsillar mass.  No tongue laceration or dental injury.      Neck:   No meningismus. Full ROM.  No midline spinal tenderness. Hematological/Lymphatic/Immunilogical:   No cervical lymphadenopathy. Cardiovascular:    RRR. Symmetric bilateral radial and DP pulses.  No murmurs. Cap refill less than 2 seconds. Respiratory:   Normal respiratory effort without tachypnea/retractions. Breath sounds are clear and equal bilaterally. No wheezes/rales/rhonchi. Gastrointestinal:   Soft and nontender. Non distended. There is no CVA tenderness.  No rebound, rigidity, or guarding. Musculoskeletal:   Normal range of motion in all extremities. No joint effusions.  No lower extremity tenderness.  No edema. Neurologic:   Normal speech and language.  Motor grossly intact.  Moving all extremities, symmetric strength. No acute focal neurologic deficits are appreciated.  Skin:    Skin is warm, dry and intact. No rash noted.  No petechiae, purpura, or bullae.  ____________________________________________    LABS (pertinent positives/negatives) (all labs ordered are listed, but only abnormal results are displayed) Labs Reviewed  BASIC METABOLIC PANEL - Abnormal; Notable for the following components:      Result Value   Sodium 134 (*)    CO2 21 (*)    Calcium 8.4 (*)    All other components within normal limits  CBC WITH DIFFERENTIAL/PLATELET - Abnormal; Notable for the following components:   RDW 11.4 (*)    All other components within normal limits  VALPROIC ACID LEVEL  LAMOTRIGINE LEVEL  POC URINE PREG, ED  POCT PREGNANCY, URINE   ____________________________________________   EKG  Interpreted by me Sinus rhythm rate of 99, normal axis intervals QRS ST segments and T waves.  No evidence of underlying dysrhythmia  ____________________________________________    RADIOLOGY  No results found.  ____________________________________________   PROCEDURES .Critical Care Performed by: Sharman Cheek, MD Authorized by: Sharman Cheek, MD   Critical care provider statement:    Critical care time (minutes):  35   Critical care time was exclusive of:  Separately billable procedures and treating other  patients   Critical care was necessary to treat or prevent imminent or life-threatening deterioration of the following conditions:  CNS failure or compromise   Critical care was time spent personally by me on the following activities:  Development of treatment plan with patient or surrogate, discussions with consultants, evaluation of patient's response to treatment, examination of patient, obtaining history from patient or surrogate, ordering and performing treatments and interventions, ordering and review of laboratory studies, ordering and review of radiographic studies, pulse oximetry, re-evaluation of patient's condition and review of old charts    ____________________________________________    CLINICAL IMPRESSION / ASSESSMENT AND PLAN / ED COURSE  Pertinent labs & imaging results that were available during my care of the patient were reviewed by me and considered in my medical decision making (see chart for details).    Patient with a  seizure disorder presents with a seizure in the setting of vague constitutional symptoms that started this morning..  No focal symptoms to suggest pneumonia or urinary tract infection.  She does note that she has been trying to follow a diet recently, so dehydration or electrolyte abnormality may have played a role.  She also notes that her Depakote levels were reported to her as being low last time she followed up with neurology but has not changed her medication dose.  Check pregnancy test, serum labs, drug levels.  Since patient is back to normal I think if work-up is reassuring and she remains at her baseline in the ED, she can be discharged to continue outpatient follow-up with neurology.  Clinical Course as of Jul 07 1240  Mon Jul 07, 2018  1213 While waiting for Lamictal level, patient had a recurrent generalized tonic-clonic seizure.  Lasted approximately 1 minute, seem to resolve on its own.  Patient was given Ativan 2 mg IV to ensure this seizure was  terminated and prevent recurrence.  With destabilization of her underlying seizure disorder, she will need to be hospitalized today for further seizure precautions.   [PS]    Clinical Course User Index [PS] Sharman Cheek, MD     ----------------------------------------- 12:42 PM on 07/07/2018 -----------------------------------------  Patient ordered 1 g of Keppra IV for further seizure prevention.  Discussed with the hospitalist to continue management.  Not in status epilepticus though currently postictal.  ____________________________________________   FINAL CLINICAL IMPRESSION(S) / ED DIAGNOSES    Final diagnoses:  Seizures [facility])     ED Discharge Orders    None      Portions of this note were generated with dragon dictation software. Dictation errors may occur despite best attempts at proofreading.   Sharman Cheek, MD 07/07/18 [phone]

## 2018-07-07 NOTE — ED Triage Notes (Signed)
Arrives to ER via [facility] from work after having approx 30 second seizure. Pt oriented X 4 at this time. CBG 134. VSS with EMS. Hx of seizures approx X 1 every 3 months-complaint with medication. Pt hs had diarrhea today, no other complaints.

## 2018-07-08 DIAGNOSIS — R569 Unspecified convulsions: Secondary | ICD-10-CM

## 2018-07-08 LAB — BASIC METABOLIC PANEL
Anion gap: 6 (ref 5–15)
BUN: 8 mg/dL (ref 6–20)
CO2: 24 mmol/L (ref 22–32)
Calcium: 7.6 mg/dL — ABNORMAL LOW (ref 8.9–10.3)
Chloride: 106 mmol/L (ref 98–111)
Creatinine, Ser: 0.67 mg/dL (ref 0.44–1.00)
GFR calc Af Amer: >60 mL/min (ref >60)
Glucose, Bld: 77 mg/dL (ref 70–99)
Potassium: 3.4 mmol/L — ABNORMAL LOW (ref 3.5–5.1)
Sodium: 136 mmol/L (ref 135–145)

## 2018-07-08 LAB — CBC
HCT: 36 % (ref 36.0–46.0)
HEMOGLOBIN: 11.9 g/dL — AB (ref 12.0–15.0)
MCH: 33.1 pg (ref 26.0–34.0)
MCHC: 33.1 g/dL (ref 30.0–36.0)
MCV: 100.3 fL — ABNORMAL HIGH (ref 80.0–100.0)
Platelets: 163 10*3/uL (ref 150–400)
RBC: 3.59 MIL/uL — ABNORMAL LOW (ref 3.87–5.11)
RDW: 11.5 % (ref 11.5–15.5)
WBC: 6.7 10*3/uL (ref 4.0–10.5)
nRBC: 0 % (ref 0.0–0.2)

## 2018-07-08 LAB — HIV ANTIBODY (ROUTINE TESTING W REFLEX): HIV Screen 4th Generation wRfx: NONREACTIVE

## 2018-07-08 MED ORDER — DIVALPROEX SODIUM 250 MG PO DR TAB: DELAYED_RELEASE_TABLET | ORAL | 0 refills | Status: DC

## 2018-07-08 NOTE — Consult Note (Signed)
Reason for Consult:Seizure Referring Physician: Alford HighlandWieting Richard  CC: Seizure  HPI: Sheila StaiJeanette I Hill is an 27 y.o. female with past medical history of depression, chronic back pain, scoliosis, and seizure disorder on Lamictal 150 mg twice daily and Depakote 250 mg TID presenting to the ED with witnessed  seizure-like activity lasting 30 seconds with LOC.  Patient states that she was at work at [employer] when episode occurred.  Patient's mother who is currently at the bedside report that patient has not been sleeping well  And is under a lot of significant stress from work and taking care of 3 small children.  She usually goes to sleep around 1am and wakes up at 6.  On that day patient reports that she woke up feeling somewhat dizzy and fatigued. Denies associated symptoms preceding the episode of aura, nausea or vomiting, feeling cold or clammy,blurry vision, palpitations shortness breath or chest pain. She denies loss of bladder control or tongue biting. Patient states that she did not start any new medication, no recent head trauma or infection.  On arrival to the ED she was noted to have a small scalp hematoma to the back of her head with no other obvious injury.  Her blood pressure was borderline low.  She was therefore loaded with Keppra 1 g IV and bolus of normal saline.  Initial labs revealed therapeutic Depakote levels of 71, normal CBC, decreased sodium levels of 134, calcium 8.4 which has dropped to 7.6 this morning. Patient report that she is feeling fine today and has not had more episodes of seizure like activity.  Past Medical History:  Diagnosis Date  . Chronic back pain   . Scoliosis   . Seizure [facility](HCC)     Past Surgical History:  Procedure Laterality Date  . CESAREAN SECTION      History reviewed. No pertinent family history.  Social History:  reports that she has never smoked. She has never used smokeless tobacco. She reports that she does not drink alcohol or use  drugs.  No Known Allergies  Medications:  I have reviewed the patient's current medications. Prior to Admission:  Medications Prior to Admission  Medication Sig Dispense Refill Last Dose  . etonogestrel (NEXPLANON) 68 MG IMPL implant 1 each by Subdermal route once.   09/11/2017 at Unknown time  . lamoTRIgine (LAMICTAL) 150 MG tablet Take 1 tablet (150 mg total) by mouth 2 (two) times daily. 60 tablet 1 07/07/2018 at 0800  . [DISCONTINUED] divalproex (DEPAKOTE) 250 MG DR tablet Take 250 mg by mouth 3 (three) times daily.   07/07/2018 at 0800  . divalproex (DEPAKOTE) 250 MG DR tablet Take 1 tablet (250 mg total) by mouth 3 (three) times daily. 90 tablet 1   . mirtazapine (REMERON) 15 MG tablet Take 1 tablet (15 mg total) by mouth at bedtime. (Patient not taking: Reported on 07/07/2018) 30 tablet 1 Not Taking at Unknown time   Scheduled: . divalproex  250 mg Oral TID  . enoxaparin (LOVENOX) injection  40 mg Subcutaneous Q24H  . lamoTRIgine  150 mg Oral BID    ROS: History obtained from the patient   General ROS: negative for - chills, fatigue, fever, night sweats, weight gain or weight loss Psychological ROS: negative for - behavioral disorder, hallucinations, memory difficulties, mood swings or suicidal ideation Ophthalmic ROS: negative for - blurry vision, double vision, eye pain or loss of vision ENT ROS: negative for - epistaxis, nasal discharge, oral lesions, sore throat, tinnitus or vertigo Allergy  and Immunology ROS: negative for - hives or itchy/watery eyes Hematological and Lymphatic ROS: negative for - bleeding problems, bruising or swollen lymph nodes Endocrine ROS: negative for - galactorrhea, hair pattern changes, polydipsia/polyuria or temperature intolerance Respiratory ROS: negative for - cough, hemoptysis, shortness of breath or wheezing Cardiovascular ROS: negative for - chest pain, dyspnea on exertion, edema or irregular heartbeat Gastrointestinal ROS: negative for -  abdominal pain, diarrhea, hematemesis, nausea/vomiting or stool incontinence Genito-Urinary ROS: negative for - dysuria, hematuria, incontinence or urinary frequency/urgency Musculoskeletal ROS: negative for - joint swelling or muscular weakness Neurological ROS: as noted in HPI Dermatological ROS: negative for rash and skin lesion changes  Physical Examination: Blood pressure 98/61, pulse 85, temperature 98.9 F (37.2 C), temperature source Oral, resp. rate 16, height 5\' 2"  (1.575 m), weight 62.7 kg, SpO2 98 %.  HEENT-  Normocephalic, no lesions, without obvious abnormality.  Normal external eye and conjunctiva.  Normal TM's bilaterally.  Normal auditory canals and external ears. Normal external nose, mucus membranes and septum.  Normal pharynx. Cardiovascular- S1, S2 normal, pulses palpable throughout   Lungs- chest clear, no wheezing, rales, normal symmetric air entry Abdomen- soft, non-tender; bowel sounds normal; no masses,  no organomegaly Extremities- no edema Lymph-no adenopathy palpable Musculoskeletal-no joint tenderness, deformity or swelling Skin-warm and dry, no hyperpigmentation, vitiligo, or suspicious lesions  Neurological Exam   Mental Status: Alert, oriented, thought content appropriate.  Speech fluent without evidence of aphasia.  Able to follow 3 step commands without difficulty. Attention span and concentration seemed appropriate  Cranial Nerves: II: Discs flat bilaterally; Visual fields grossly normal, pupils equal, round, reactive to light and accommodation III,IV, VI: ptosis not present, extra-ocular motions intact bilaterally V,VII: smile symmetric, facial light touch sensation intact VIII: hearing normal bilaterally IX,X: gag reflex present XI: bilateral shoulder shrug XII: midline tongue extension Motor: Right :  Upper extremity   5/5 Without pronator drift      Left: Upper extremity   5/5 without pronator drift Right:   Lower extremity   5/5                                           Left: Lower extremity   5/5 Tone and bulk:normal tone throughout; no atrophy noted Sensory: Pinprick and light touch intact bilaterally Deep Tendon Reflexes: 2+ and symmetric throughout Plantars: Right: downgoing                             Left: downgoing Cerebellar: Finger-to-nose testing intact bilaterally. Heel to shin testing normal bilaterally Gait: not tested due to safety concerns  Data Reviewed  Laboratory Studies:   Basic Metabolic Panel: Recent Labs  Lab 07/07/18 0904 07/08/18 0554  NA 134* 136  K 3.7 3.4*  CL 102 106  CO2 21* 24  GLUCOSE 86 77  BUN 12 8  CREATININE 0.84 0.67  CALCIUM 8.4* 7.6*    Liver Function Tests: No results for input(s): AST, ALT, ALKPHOS, BILITOT, PROT, ALBUMIN in the last 168 hours. No results for input(s): LIPASE, AMYLASE in the last 168 hours. No results for input(s): AMMONIA in the last 168 hours.  CBC: Recent Labs  Lab 07/07/18 0904 07/08/18 0554  WBC 6.6 6.7  NEUTROABS 3.3  --   HGB 12.8 11.9*  HCT 37.8 36.0  MCV 97.4 100.3*  PLT 171 163  Cardiac Enzymes: No results for input(s): CKTOTAL, CKMB, CKMBINDEX, TROPONINI in the last 168 hours.  BNP: Invalid input(s): POCBNP  CBG: No results for input(s): GLUCAP in the last 168 hours.  Microbiology: No results found for this or any previous visit.  Coagulation Studies: No results for input(s): LABPROT, INR in the last 72 hours.  Urinalysis: No results for input(s): COLORURINE, LABSPEC, PHURINE, GLUCOSEU, HGBUR, BILIRUBINUR, KETONESUR, PROTEINUR, UROBILINOGEN, NITRITE, LEUKOCYTESUR in the last 168 hours.  Invalid input(s): APPERANCEUR  Lipid Panel:     Component Value Date/Time   CHOL 124 09/12/2017 0708   TRIG 129 09/12/2017 0708   HDL 35 (L) 09/12/2017 0708   CHOLHDL 3.5 09/12/2017 0708   VLDL 26 09/12/2017 0708   LDLCALC 63 09/12/2017 0708    HgbA1C:  Lab Results  Component Value Date   HGBA1C 4.5 (L) 09/12/2017    Urine  Drug Screen:      Component Value Date/Time   LABOPIA NONE DETECTED 09/10/2017 2358   COCAINSCRNUR NONE DETECTED 09/10/2017 2358   LABBENZ NONE DETECTED 09/10/2017 2358   AMPHETMU NONE DETECTED 09/10/2017 2358   THCU NONE DETECTED 09/10/2017 2358   LABBARB NONE DETECTED 09/10/2017 2358    Alcohol Level: No results for input(s): ETH in the last 168 hours.  Other results: EKG: there are no previous tracings available for comparison.  Imaging: No results found.  Assessment: 27 year old female with past medical history of depression, chronic back pain, scoliosis, and seizure disorder on Lamictal 150 mg twice daily and Depakote 250 mg TID presenting to the ED with witnessed  seizure-like activity lasting 30 seconds with LOC. Likely breakthrough seizure in the setting of significant stress and lack of sleep which could possibly lower seizure threshold.  Labs revealed Depakote levels of 71, normal CBC, decreased sodium levels of 134, calcium 8.4 which has dropped to 7.6 this morning Denies recent change in medication, head trauma or infection.  She is compliant with her seizure medication however reports some difficulty taking the middle dose of Depakote due to to work schedule.    Recommendation 1. Seizure precautions 2. Ativan prn seizure activity 3. Change Depakote to 250 mg in the morning and 500mg  and bedtime to improve compliance. 4. Continue current Lamictal dose.   5. Follow-up with outpatient neurology Dr. Hale Bogus as scheduled.  6. Patient unable to drive, operate heavy machinery, perform activities at heights and participate in water activities until release by outpatient physician.  This patient was staffed with Dr. Loretha Brasil, Doyle Askew who personally evaluated patient, reviewed documentation and agreed with assessment and plan of care as above.  Webb Silversmith, DNP, FNP-BC Board certified Nurse Practitioner Neurology Department   07/08/2018, 11:08 AM

## 2018-07-08 NOTE — Discharge Summary (Signed)
[facility] - [place] [facility]   PATIENT NAME: Sheila Hill    MR#:  [id number]  DATE OF BIRTH:  Aug 12, 1990  DATE OF ADMISSION:  07/07/2018 ADMITTING PHYSICIAN: Ihor AustinPavan Pyreddy, MD  DATE OF DISCHARGE: 07/08/2018  PRIMARY CARE PHYSICIAN: [facility]    ADMISSION DIAGNOSIS:  Seizures (HCC) [R56.9]  DISCHARGE DIAGNOSIS:  Active Problems:   Seizure (HCC)   SECONDARY DIAGNOSIS:   Past Medical History:  Diagnosis Date  . Chronic back pain   . Scoliosis   . Seizure [facility](HCC)     HOSPITAL COURSE:   1.  Seizure with history of seizure disorder.  Patient was given Keppra in the emergency room.  She was seen by neurology.  They recommended getting more sleep and decreasing stressors.  They change the Depakote to 250 in the morning and 500 at night.  Continue the Lamictal.  Follow-up with Dr. Malvin JohnsPotter as outpatient. 2.  Hyponatremia was one-point lower than the normal range and now in the normal range. 3.  Relative hypotension.  Blood pressure on the lower side.  Patient feels fine so I am not can put too much emphasis on this.  DISCHARGE CONDITIONS:   Satisfactory  CONSULTS OBTAINED:  Treatment Team:  K[facility], Neuro1, MD Pauletta BrownsZeylikman, Yuriy, MD  DRUG ALLERGIES:  No Known Allergies  DISCHARGE MEDICATIONS:   Allergies as of 07/08/2018   No Known Allergies     Medication List    STOP taking these medications   mirtazapine 15 MG tablet Commonly known as:  REMERON     TAKE these medications   divalproex 250 MG DR tablet Commonly known as:  DEPAKOTE Take one tablet in the morning and two tablets at night What changed:    how much to take  how to take this  when to take this  additional instructions  Another medication with the same name was removed. Continue taking this medication, and follow the directions you see here.   etonogestrel 68 MG Impl implant Commonly known as:  NEXPLANON 1 each by Subdermal route  once.   lamoTRIgine 150 MG tablet Commonly known as:  LAMICTAL Take 1 tablet (150 mg total) by mouth 2 (two) times daily.        DISCHARGE INSTRUCTIONS:   Follow-up PMD 5 days Follow-up neurology 2 weeks  If you experience worsening of your admission symptoms, develop shortness of breath, life threatening emergency, suicidal or homicidal thoughts you must seek medical attention immediately by calling 911 or calling your MD immediately  if symptoms less severe.  You Must read complete instructions/literature along with all the possible adverse reactions/side effects for all the Medicines you take and that have been prescribed to you. Take any new Medicines after you have completely understood and accept all the possible adverse reactions/side effects.   Please note  You were cared for by a hospitalist during your hospital stay. If you have any questions about your discharge medications or the care you received while you were in the hospital after you are discharged, you can call the unit and asked to speak with the hospitalist on call if the hospitalist that took care of you is not available. Once you are discharged, your primary care physician will handle any further medical issues. Please note that NO REFILLS for any discharge medications will be authorized once you are discharged, as it is imperative that you return to your primary care physician (or establish a relationship with a primary care physician if  you do not have one) for your aftercare needs so that they can reassess your need for medications and monitor your lab values.    Today   CHIEF COMPLAINT:   Chief Complaint  Patient presents with  . Seizures    HISTORY OF PRESENT ILLNESS:  Sheila Hill  is a 27 y.o. female with a known history of seizures presents after a seizure.   VITAL SIGNS:  Blood pressure 100/65, pulse 89, temperature (!) 100.5 F (38.1 C), temperature source Oral, resp. rate 20, height 5\' 2"   (1.575 m), weight 62.7 kg, SpO2 100 %.  Was not notified about the patient's low-grade temperature upon discharge.  Temperature prior was normal and 98 9.  Patient had already left the hospital by the time I noticed this.  Patient did not have a white blood cell count.   PHYSICAL EXAMINATION:  GENERAL:  27 y.o.-year-old patient lying in the bed with no acute distress.  EYES: Pupils equal, round, reactive to light and accommodation. No scleral icterus. Extraocular muscles intact.  HEENT: Head atraumatic, normocephalic. Oropharynx and nasopharynx clear.  NECK:  Supple, no jugular venous distention. No thyroid enlargement, no tenderness.  LUNGS: Normal breath sounds bilaterally, no wheezing, rales,rhonchi or crepitation. No use of accessory muscles of respiration.  CARDIOVASCULAR: S1, S2 normal. No murmurs, rubs, or gallops.  ABDOMEN: Soft, non-tender, non-distended. Bowel sounds present. No organomegaly or mass.  EXTREMITIES: No pedal edema, cyanosis, or clubbing.  NEUROLOGIC: Cranial nerves II through XII are intact. Muscle strength 5/5 in all extremities. Sensation intact. Gait not checked.  PSYCHIATRIC: The patient is alert and oriented x 3.  SKIN: No obvious rash, lesion, or ulcer.   DATA REVIEW:   CBC Recent Labs  Lab 07/08/18 0554  WBC 6.7  HGB 11.9*  HCT 36.0  PLT 163    Chemistries  Recent Labs  Lab 07/08/18 0554  NA 136  K 3.4*  CL 106  CO2 24  GLUCOSE 77  BUN 8  CREATININE 0.67  CALCIUM 7.6*     Management plans discussed with the patient, family and they are in agreement.  CODE STATUS:     Code Status Orders  (From admission, onward)         Start     Ordered   07/07/18 1406  Full code  Continuous     07/07/18 1405        Code Status History    Date Active Date Inactive Code Status Order ID Comments User Context   09/11/2017 2313 09/13/2017 1833 Full Code [id number]  Clapacs, Jackquline Denmark, MD Inpatient      TOTAL TIME TAKING CARE OF THIS PATIENT: 35  minutes.    Alford Highland M.D on 07/08/2018 at 1:46 PM  Between 7am to 6pm - Pager - [phone]  After 6pm go to [email or web address] - Social research officer, government  [facility] Office  [phone]  CC: Primary care physician; [facility]

## 2018-07-08 NOTE — Care Management Note (Signed)
Case Management Note  Patient Details  Name: Sheila Hill MRN: [id number] Date of Birth: [birth year]  Subjective/Objective:    Admitted to [facility] with the diagnosis of seizure. Lives with mother, Sheila Hill, [phone]). States [facility] to [facility].  She only sees [place] physician in [place].  States if she gets prescriptions [facility] at [facility]. States the only medications  She has are her birth control pills, which she does take.               Action/Plan: Received a referral indicating that she will loose her Medicaid at the end of this month. Sheila Hill states she doesn't have Medicaid. Discussed calling the Department of Social Services and arranging an appointment to apply for Medicaid   Expected Discharge Date:  07/08/18               Expected Discharge Plan:     In-House Referral:   yes  Discharge planning Services    yes Post Acute Care Choice:    Choice offered to:     DME Arranged:    DME Agency:     HH Arranged:    HH Agency:     Status of Service:     If discussed at [employer] Meetings, dates discussed:    Additional Comments:  Sheila GreetBrenda S Tareek Sabo, RN MSN CCM Care Management [phone] 07/08/2018, 11:15 AM

## 2018-07-08 NOTE — Plan of Care (Signed)

## 2018-07-08 NOTE — Discharge Instructions (Signed)
No Driving  Seizure, Adult When you have a seizure:  Parts of your body may move.  How aware or awake (conscious) you are may change.  You may shake (convulse).  Some people have symptoms right before a seizure happens. These symptoms may include:  Fear.  Worry (anxiety).  Feeling like you are going to throw up (nausea).  Feeling like the room is spinning (vertigo).  Feeling like you saw or heard something before (deja vu).  Odd tastes or smells.  Changes in vision, such as seeing flashing lights or spots.  Seizures usually last from 30 seconds to 2 minutes. Usually, they are not harmful unless they last a long time. Follow these instructions at home: Medicines  Take over-the-counter and prescription medicines only as told by your doctor.  Avoid anything that may keep your medicine from working, such as alcohol. Activity  Do not do any activities that would be dangerous if you had another seizure, like driving or swimming. Wait until your doctor approves.  If you live in the [place] ask your local DMV (department of motor vehicles) when you can drive.  Rest. Teaching others  Teach friends and family what to do when you have a seizure. They should: ? Lay you on the ground. ? Protect your head and body. ? Loosen any tight clothing around your neck. ? Turn you on your side. ? Stay with you until you are better. ? Not hold you down. ? Not put anything in your mouth. ? Know whether or not you need emergency care. General instructions  Contact your doctor each time you have a seizure.  Avoid anything that gives you seizures.  Keep a seizure diary. Write down: ? What you think caused each seizure. ? What you remember about each seizure.  Keep all follow-up visits as told by your doctor. This is important. Contact a doctor if:  You have another seizure.  You have seizures more often.  There is any change in what happens during your seizures.  You continue  to have seizures with treatment.  You have symptoms of being sick or having an infection. Get help right away if:  You have a seizure: ? That lasts longer than 5 minutes. ? That is different than seizures you had before. ? That makes it harder to breathe. ? After you hurt your head.  After a seizure, you cannot speak or use a part of your body.  After a seizure, you are confused or have a bad headache.  You have two or more seizures in a row.  You are having seizures more often.  You do not wake up right after a seizure.  You get hurt during a seizure. In an emergency:  These symptoms may be an emergency. Do not wait to see if the symptoms will go away. Get medical help right away. Call your local emergency services (911 in the [place] Do not drive yourself to the hospital. This information is not intended to replace advice given to you by your health care provider. Make sure you discuss any questions you have with your health care provider. Document Released: 12/26/2007 Document Revised: 03/21/2016 Document Reviewed: 03/21/2016 Elsevier Interactive Patient Education  2017 [employer]

## 2018-07-09 LAB — LAMOTRIGINE LEVEL: Lamotrigine Lvl: 4.8 ug/mL (ref 2.0–20.0)

## 2020-08-21 ENCOUNTER — Emergency Department
Admission: EM | Admit: 2020-08-21 | Discharge: 2020-08-21 | Disposition: A | Discharge status: Home / Self Care | Payer: Medicaid Other | Attending: Emergency Medicine | Admitting: Emergency Medicine

## 2020-08-21 ENCOUNTER — Encounter: Payer: Self-pay | Admitting: Emergency Medicine

## 2020-08-21 ENCOUNTER — Emergency Department: Payer: Medicaid Other

## 2020-08-21 ENCOUNTER — Other Ambulatory Visit: Payer: Self-pay

## 2020-08-21 DIAGNOSIS — Z79899 Other long term (current) drug therapy: Secondary | ICD-10-CM | POA: Insufficient documentation

## 2020-08-21 DIAGNOSIS — W01198A Fall on same level from slipping, tripping and stumbling with subsequent striking against other object, initial encounter: Secondary | ICD-10-CM | POA: Diagnosis not present

## 2020-08-21 DIAGNOSIS — S0033XA Contusion of nose, initial encounter: Secondary | ICD-10-CM

## 2020-08-21 DIAGNOSIS — Z760 Encounter for issue of repeat prescription: Secondary | ICD-10-CM | POA: Insufficient documentation

## 2020-08-21 DIAGNOSIS — R569 Unspecified convulsions: Secondary | ICD-10-CM

## 2020-08-21 DIAGNOSIS — S0992XA Unspecified injury of nose, initial encounter: Secondary | ICD-10-CM | POA: Diagnosis present

## 2020-08-21 LAB — BASIC METABOLIC PANEL
Anion gap: 9 (ref 5–15)
BUN: 9 mg/dL (ref 6–20)
CO2: 26 mmol/L (ref 22–32)
Calcium: 8.5 mg/dL — ABNORMAL LOW (ref 8.9–10.3)
Chloride: 103 mmol/L (ref 98–111)
Creatinine, Ser: 0.63 mg/dL (ref 0.44–1.00)
GFR, Estimated: >60 mL/min (ref >60)
Glucose, Bld: 91 mg/dL (ref 70–99)
Potassium: 3.9 mmol/L (ref 3.5–5.1)
Sodium: 138 mmol/L (ref 135–145)

## 2020-08-21 LAB — VALPROIC ACID LEVEL: Valproic Acid Lvl: <10 ug/mL — ABNORMAL LOW (ref 50.0–100.0)

## 2020-08-21 LAB — HCG, QUANTITATIVE, PREGNANCY: hCG, Beta Chain, Quant, S: <1 m[IU]/mL (ref <5)

## 2020-08-21 MED ORDER — DIVALPROEX SODIUM 250 MG PO DR TAB: DELAYED_RELEASE_TABLET | ORAL | 0 refills | Status: AC

## 2020-08-21 MED ORDER — DIVALPROEX SODIUM 500 MG PO DR TAB
500.0000 mg | DELAYED_RELEASE_TABLET | Freq: Once | ORAL | Status: AC
  Administered 2020-08-21: 500 mg via ORAL
  Filled 2020-08-21: qty 1

## 2020-08-21 MED ORDER — DIVALPROEX SODIUM 250 MG PO DR TAB: DELAYED_RELEASE_TABLET | ORAL | 0 refills | Status: DC

## 2020-08-21 MED ORDER — ACETAMINOPHEN 500 MG PO TABS
1000.0000 mg | ORAL_TABLET | Freq: Once | ORAL | Status: AC
  Administered 2020-08-21: 1000 mg via ORAL
  Filled 2020-08-21: qty 2

## 2020-08-21 NOTE — ED Provider Notes (Signed)
[facility] Emergency Department Provider Note  ____________________________________________   Event Date/Time   First MD Initiated Contact with Patient 08/21/20 1240     (approximate)  I have reviewed the triage vital signs and the nursing notes.   HISTORY  Chief Complaint Seizures   HPI Sheila Hill is a 30 y.o. female with a past medical history of scoliosis, depression, and epilepsy currently on Depakote and Lamictal who presents after a seizure that was witnessed at her place of work today.  Patient states she was told she had generalized shaking and fell forward hitting her face does not remember exactly what happened.  He states he has been out of her Depakote for the last couple days and has been taking her Lamictal.  States she currently has little pain over her nose but no other symptoms including headache, earache, chest pain, cough, shortness of breath, nausea, vomiting, diarrhea or dysuria, rash, recent injuries or falls or other recent seizures.         Past Medical History:  Diagnosis Date  . Chronic back pain   . Scoliosis   . Seizure [facility])     Patient Active Problem List   Diagnosis Date Noted  . Seizure (HCC) 07/07/2018  . Severe major depression, single episode, without psychotic features (HCC) 09/11/2017  . Suicide attempt (HCC) 09/11/2017  . Epilepsy (HCC) 09/11/2017    Past Surgical History:  Procedure Laterality Date  . CESAREAN SECTION      Prior to Admission medications   Medication Sig Start Date End Date Taking? Authorizing Provider  divalproex (DEPAKOTE) 250 MG DR tablet Take one tablet in the morning and two tablets at night 08/21/20   Gilles Chiquito, MD  etonogestrel (NEXPLANON) 68 MG IMPL implant 1 each by Subdermal route once.    [provider]  lamoTRIgine (LAMICTAL) 150 MG tablet Take 1 tablet (150 mg total) by mouth 2 (two) times daily. 09/12/17   Pucilowska, Ellin Goodie, MD     Allergies Patient has no known allergies.  No family history on file.  Social History Social History   Tobacco Use  . Smoking status: Never Smoker  . Smokeless tobacco: Never Used  Vaping Use  . Vaping Use: Never used  Substance Use Topics  . Alcohol use: No  . Drug use: No    Review of Systems  Review of Systems  Constitutional: Negative for chills and fever.  HENT: Negative for sore throat.   Eyes: Negative for pain.  Respiratory: Negative for cough and stridor.   Cardiovascular: Negative for chest pain.  Gastrointestinal: Negative for vomiting.  Genitourinary: Negative for dysuria.  Musculoskeletal: Negative for myalgias.  Skin: Negative for rash.  Neurological: Positive for seizures, loss of consciousness and headaches.  Psychiatric/Behavioral: Negative for suicidal ideas.  All other systems reviewed and are negative.     ____________________________________________   PHYSICAL EXAM:  VITAL SIGNS: ED Triage Vitals  Enc Vitals Group     BP 08/21/20 1003 (!) 95/56     Pulse Rate 08/21/20 1003 98     Resp 08/21/20 1003 20     Temp 08/21/20 1003 98.4 F (36.9 C)     Temp Source 08/21/20 1003 Oral     SpO2 08/21/20 1003 96 %     Weight 08/21/20 1000 143 lb (64.9 kg)     Height 08/21/20 1000 5\' 2"  (1.575 m)     Head Circumference --      Peak Flow --  Pain Score 08/21/20 1000 0     Pain Loc --      Pain Edu? --      Excl. in GC? --    Vitals:   08/21/20 1244 08/21/20 1430  BP: 117/81 94/63  Pulse: 78 91  Resp: 18 18  Temp:    SpO2: 99% 99%   Physical Exam Vitals and nursing note reviewed.  Constitutional:      General: She is not in acute distress.    Appearance: She is well-developed and well-nourished.  HENT:     Head: Normocephalic and atraumatic.     Right Ear: External ear normal.     Left Ear: External ear normal.     Nose: Nose normal.     Mouth/Throat:     Mouth: Mucous membranes are moist.  Eyes:     Conjunctiva/sclera:  Conjunctivae normal.  Cardiovascular:     Rate and Rhythm: Normal rate and regular rhythm.     Heart sounds: No murmur heard.   Pulmonary:     Effort: Pulmonary effort is normal. No respiratory distress.     Breath sounds: Normal breath sounds.  Abdominal:     Palpations: Abdomen is soft.     Tenderness: There is no abdominal tenderness.  Musculoskeletal:        General: No edema.     Cervical back: Neck supple.  Skin:    General: Skin is warm and dry.     Capillary Refill: Capillary refill takes less than 2 seconds.  Neurological:     Mental Status: She is alert and oriented to person, place, and time.  Psychiatric:        Mood and Affect: Mood and affect and mood normal.     Cranial nerves II through XII grossly intact.  No pronator drift.  No finger dysmetria.  Symmetric 5/5 strength of all extremities.  Sensation intact to light touch in all extremities.  Unremarkable unassisted gait.  Patient has some ecchymosis of her nasal bridge and some mild tenderness.  Septum is unremarkable.  No other obvious trauma to the face scalp head neck and oropharynx is unremarkable.  Extremities are unremarkable.  2+ bilateral radial pulse. ____________________________________________   LABS (all labs ordered are listed, but only abnormal results are displayed)  Labs Reviewed  VALPROIC ACID LEVEL - Abnormal; Notable for the following components:      Result Value   Valproic Acid Lvl <10 (*)    All other components within normal limits  BASIC METABOLIC PANEL - Abnormal; Notable for the following components:   Calcium 8.5 (*)    All other components within normal limits  HCG, QUANTITATIVE, PREGNANCY  LAMOTRIGINE LEVEL  CBG MONITORING, ED  POC URINE PREG, ED   ____________________________________________  EKG  ____________________________________________  RADIOLOGY  ED MD interpretation: CT head with no fracture or intracranial hemorrhage or other clear acute intracranial  process.  CT face shows no evidence of acute fracture or dislocation.  Official radiology report(s): CT Head Wo Contrast  Result Date: 08/21/2020 CLINICAL DATA:  The patient had a seizure and fell, hitting her head on a counter. She takes medication for seizures. EXAM: CT HEAD WITHOUT CONTRAST CT MAXILLOFACIAL WITHOUT CONTRAST TECHNIQUE: Multidetector CT imaging of the head and maxillofacial structures were performed using the standard protocol without intravenous contrast. Multiplanar CT image reconstructions of the maxillofacial structures were also generated. COMPARISON:  07/23/2014.  Brain MR dated 08/05/2014.  The FINDINGS: CT HEAD FINDINGS Brain: Streak artifacts at the  skull base. Otherwise, normal appearing cerebral hemispheres and posterior fossa structures. Normal size and position of the ventricles. No intracranial hemorrhage, mass lesion or CT evidence of acute infarction. Vascular: No hyperdense vessel or unexpected calcification. Skull: Normal. Negative for fracture or focal lesion. Other: None. CT MAXILLOFACIAL FINDINGS Osseous: Old, healed, minimally depressed distal nasal bone fracture. No acute fracture or mandibular dislocation. No destructive process. Orbits: Negative. No traumatic or inflammatory finding. Sinuses: Mild bilateral maxillary sinus mucosal thickening. Moderate retention cyst formation in the inferior left maxillary sinus. Soft tissues: Unremarkable. IMPRESSION: 1. No skull fracture or intracranial hemorrhage. 2. No acute maxillofacial fracture. 3. Old, healed, minimally depressed distal nasal bone fracture. 4. Mild chronic bilateral maxillary sinusitis. Electronically Signed   By: Beckie Salts M.D.   On: 08/21/2020 14:02   CT Maxillofacial Wo Contrast  Result Date: 08/21/2020 CLINICAL DATA:  The patient had a seizure and fell, hitting her head on a counter. She takes medication for seizures. EXAM: CT HEAD WITHOUT CONTRAST CT MAXILLOFACIAL WITHOUT CONTRAST TECHNIQUE:  Multidetector CT imaging of the head and maxillofacial structures were performed using the standard protocol without intravenous contrast. Multiplanar CT image reconstructions of the maxillofacial structures were also generated. COMPARISON:  07/23/2014.  Brain MR dated 08/05/2014.  The FINDINGS: CT HEAD FINDINGS Brain: Streak artifacts at the skull base. Otherwise, normal appearing cerebral hemispheres and posterior fossa structures. Normal size and position of the ventricles. No intracranial hemorrhage, mass lesion or CT evidence of acute infarction. Vascular: No hyperdense vessel or unexpected calcification. Skull: Normal. Negative for fracture or focal lesion. Other: None. CT MAXILLOFACIAL FINDINGS Osseous: Old, healed, minimally depressed distal nasal bone fracture. No acute fracture or mandibular dislocation. No destructive process. Orbits: Negative. No traumatic or inflammatory finding. Sinuses: Mild bilateral maxillary sinus mucosal thickening. Moderate retention cyst formation in the inferior left maxillary sinus. Soft tissues: Unremarkable. IMPRESSION: 1. No skull fracture or intracranial hemorrhage. 2. No acute maxillofacial fracture. 3. Old, healed, minimally depressed distal nasal bone fracture. 4. Mild chronic bilateral maxillary sinusitis. Electronically Signed   By: Beckie Salts M.D.   On: 08/21/2020 14:02    ____________________________________________   PROCEDURES  Procedure(s) performed (including Critical Care):  Procedures   ____________________________________________   INITIAL IMPRESSION / ASSESSMENT AND PLAN / ED COURSE        Patient presents with above to history exam after witnessed seizure episode where she fell striking her nose after she ran out of her Depakote medications and has not been taking the last couple days.  On arrival she is afebrile and hemodynamically stable.  She is nonfocal neuro exam and no evidence of seizure activity on arrival.  Suspect patient  may have had a breakthrough seizure in the setting of missing several doses of her Depakote.  She also endorses not sleeping very well which can also be lowering her seizure threshold.  No other recent traumatic injuries or falls.  No evidence on history or exam of acute infectious process.  Low suspicion for toxic ingestion.  BMP shows no significant electrolyte or metabolic derangements.  Depakote level is undetectable.  CT head and face show no evidence of skull fracture or intracranial hemorrhage or facial fracture.  Patient was given some Tylenol ibuprofen and and a dose of her Depakote.  Refill prescription provided.  Patient does not appear postictal and given stable vitals otherwise reassuring exam and work-up which is safe for discharge.  Discharged in stable condition.  Strict return precautions discussed and provided in writing.  ____________________________________________   FINAL CLINICAL IMPRESSION(S) / ED DIAGNOSES  Final diagnoses:  Seizure (HCC)  Medication refill  Contusion of nose, initial encounter    Medications  divalproex (DEPAKOTE) DR tablet 500 mg (500 mg Oral Given 08/21/20 1258)  acetaminophen (TYLENOL) tablet 1,000 mg (1,000 mg Oral Given 08/21/20 1257)     ED Discharge Orders         Ordered    divalproex (DEPAKOTE) 250 MG DR tablet  Status:  Discontinued        08/21/20 1426    divalproex (DEPAKOTE) 250 MG DR tablet        08/21/20 1431           Note:  This document was prepared using Dragon voice recognition software and may include unintentional dictation errors.   Gilles Chiquito, MD 08/21/20 [phone]

## 2020-08-21 NOTE — ED Triage Notes (Signed)
Pt in via EMS from work with c/o witnessed seizures, 2 in a row. Pt hit head on counter when she had a seizure and fell. Pt with hx of seizures and takes meds for them. FSBS 126, HR 110/70, HR 102

## 2020-08-21 NOTE — ED Triage Notes (Signed)
Pt reports had 2 seizures this am. Pt states hx of seizures and reports 2 months ago her MD lowered her dosage but she can not remember which one between lamictal and depakote that was lowered

## 2020-08-21 NOTE — ED Triage Notes (Signed)
Pt reports she did fall and hit her nose but denies pain at this time. Slight swelling noted

## 2020-08-21 NOTE — ED Notes (Signed)
Taken to ct  

## 2020-08-23 LAB — LAMOTRIGINE LEVEL: Lamotrigine Lvl: 4.3 ug/mL (ref 2.0–20.0)

## 2020-12-31 ENCOUNTER — Emergency Department: Payer: Medicaid Other

## 2020-12-31 ENCOUNTER — Other Ambulatory Visit: Payer: Self-pay

## 2020-12-31 ENCOUNTER — Emergency Department
Admission: EM | Admit: 2020-12-31 | Discharge: 2020-12-31 | Disposition: A | Discharge status: Home / Self Care | Payer: Medicaid Other | Attending: Emergency Medicine | Admitting: Emergency Medicine

## 2020-12-31 DIAGNOSIS — R569 Unspecified convulsions: Secondary | ICD-10-CM

## 2020-12-31 DIAGNOSIS — R5383 Other fatigue: Secondary | ICD-10-CM | POA: Diagnosis not present

## 2020-12-31 LAB — CBC
HCT: 37.4 % (ref 36.0–46.0)
Hemoglobin: 13.1 g/dL (ref 12.0–15.0)
MCH: 34.3 pg — ABNORMAL HIGH (ref 26.0–34.0)
MCHC: 35 g/dL (ref 30.0–36.0)
MCV: 97.9 fL (ref 80.0–100.0)
Platelets: 190 10*3/uL (ref 150–400)
RBC: 3.82 MIL/uL — ABNORMAL LOW (ref 3.87–5.11)
RDW: 11.6 % (ref 11.5–15.5)
WBC: 7.4 10*3/uL (ref 4.0–10.5)
nRBC: 0 % (ref 0.0–0.2)

## 2020-12-31 LAB — COMPREHENSIVE METABOLIC PANEL
ALT: 23 U/L (ref 0–44)
AST: 73 U/L — ABNORMAL HIGH (ref 15–41)
Albumin: 3.8 g/dL (ref 3.5–5.0)
Alkaline Phosphatase: 46 U/L (ref 38–126)
Anion gap: 9 (ref 5–15)
BUN: 9 mg/dL (ref 6–20)
CO2: 23 mmol/L (ref 22–32)
Calcium: 8.2 mg/dL — ABNORMAL LOW (ref 8.9–10.3)
Chloride: 106 mmol/L (ref 98–111)
Creatinine, Ser: 0.72 mg/dL (ref 0.44–1.00)
GFR, Estimated: >60 mL/min (ref >60)
Glucose, Bld: 98 mg/dL (ref 70–99)
Potassium: 3.7 mmol/L (ref 3.5–5.1)
Sodium: 138 mmol/L (ref 135–145)
Total Bilirubin: 0.7 mg/dL (ref 0.3–1.2)
Total Protein: 6.7 g/dL (ref 6.5–8.1)

## 2020-12-31 LAB — URINALYSIS, COMPLETE (UACMP) WITH MICROSCOPIC
Bilirubin Urine: NEGATIVE
Glucose, UA: NEGATIVE mg/dL
Hgb urine dipstick: NEGATIVE
Ketones, ur: NEGATIVE mg/dL
Leukocytes,Ua: NEGATIVE
Nitrite: NEGATIVE
Protein, ur: 30 mg/dL — AB
Specific Gravity, Urine: 1.017 (ref 1.005–1.030)
pH: 7 (ref 5.0–8.0)

## 2020-12-31 LAB — PREGNANCY, URINE: Preg Test, Ur: NEGATIVE

## 2020-12-31 NOTE — ED Provider Notes (Signed)
Emergency department handoff note  Care of this patient was signed out to me at the end of the previous value shift.  All pertinent patient information was conveyed and all questions were answered.  Patient was pending a urinalysis as a possible source for these breakthrough seizures however patient has been medication nonadherent.  Patient's urinalysis did not show any evidence of urinary tract infection.  The patient has been reexamined and is ready to be discharged.  All diagnostic results have been reviewed and discussed with the patient/family.  Care plan has been outlined and the patient/family understands all current diagnoses, results, and treatment plans.  There are no new complaints, changes, or physical findings at this time.  All questions have been addressed and answered.  Patient was instructed to, and agrees to follow-up with their primary care physician as well as return to the emergency department if any new or worsening symptoms develop.   Merwyn Katos, MD 12/31/20 1517

## 2020-12-31 NOTE — Discharge Instructions (Signed)
Please begin taking your medications as prescribed by your doctor.  Please drink plenty of fluids and obtain plenty of rest over the weekend.  Return to the emergency department for any further seizure episodes.  Do not drive until cleared by your neurologist.

## 2020-12-31 NOTE — ED Provider Notes (Signed)
[facility] Emergency Department Provider Note  Time seen: 1:22 PM  I have reviewed the triage vital signs and the nursing notes.   HISTORY  Chief Complaint Seizures   HPI Sheila Hill is a 30 y.o. female with a past medical history of seizures, presents to the emergency department after a seizure.  According to the patient she believes she had a seizure today while at home.  Family members here with the patient he states she witnessed the seizure, patient fell and did hit her head.  Seizure lasted approximately 1 minute and then the patient was confused and fatigued following the seizure consistent with a postictal period.  Patient does admit that she missed her dose of medications last night as well as this morning but states otherwise she believes she has been taking them fairly consistently.  Denies any drugs or alcohol.  Denies any recent fever cough congestion or illness.   Past Medical History:  Diagnosis Date   Chronic back pain    Scoliosis    Seizure [facility])     Patient Active Problem List   Diagnosis Date Noted   Seizure (HCC) 07/07/2018   Severe major depression, single episode, without psychotic features (HCC) 09/11/2017   Suicide attempt (HCC) 09/11/2017   Epilepsy (HCC) 09/11/2017    Past Surgical History:  Procedure Laterality Date   CESAREAN SECTION      Prior to Admission medications   Medication Sig Start Date End Date Taking? Authorizing Provider  divalproex (DEPAKOTE) 250 MG DR tablet Take one tablet in the morning and two tablets at night 08/21/20   Gilles Chiquito, MD  etonogestrel (NEXPLANON) 68 MG IMPL implant 1 each by Subdermal route once.    [provider]  lamoTRIgine (LAMICTAL) 150 MG tablet Take 1 tablet (150 mg total) by mouth 2 (two) times daily. 09/12/17   Pucilowska, Ellin Goodie, MD    No Known Allergies  No family history on file.  Social History Social History   Tobacco Use   Smoking status: Never    Smokeless tobacco: Never  Vaping Use   Vaping Use: Never used  Substance Use Topics   Alcohol use: No   Drug use: No    Review of Systems Constitutional: Negative for fever. Cardiovascular: Negative for chest pain. Respiratory: Negative for shortness of breath. Gastrointestinal: Negative for abdominal pain Musculoskeletal: Negative for musculoskeletal complaints Skin: Negative for skin complaints  Neurological: Negative for headache All other ROS negative  ____________________________________________   PHYSICAL EXAM:  VITAL SIGNS: ED Triage Vitals [12/31/20 1251]  Enc Vitals Group     BP 96/66     Pulse Rate 92     Resp 18     Temp 98.4 F (36.9 C)     Temp Source Oral     SpO2 97 %     Weight 145 lb (65.8 kg)     Height 5\' 4"  (1.626 m)     Head Circumference      Peak Flow      Pain Score 0     Pain Loc      Pain Edu?      Excl. in GC?    Constitutional: Alert and oriented. Well appearing and in no distress. Eyes: Normal exam ENT      Head: Normocephalic and atraumatic.      Mouth/Throat: Mucous membranes are moist. Cardiovascular: Normal rate, regular rhythm.  Respiratory: Normal respiratory effort without tachypnea nor retractions. Breath sounds are clear  Gastrointestinal: Soft and nontender. No distention.   Musculoskeletal: Nontender with normal range of motion in all extremities.  Neurologic:  Normal speech and language. No gross focal neurologic deficits  Skin:  Skin is warm, dry and intact.  Psychiatric: Mood and affect are normal.   ____________________________________________     RADIOLOGY  CT of the head is negative.  ____________________________________________   INITIAL IMPRESSION / ASSESSMENT AND PLAN / ED COURSE  Pertinent labs & imaging results that were available during my care of the patient were reviewed by me and considered in my medical decision making (see chart for details).   Patient presents emergency department for a  seizure witnessed at home.  Did hit her head.  Patient missed her medications last night as well as this morning.  Patient has her home medications with her.  We will have the patient take her normal morning dose now.  We will check labs and obtain a CT scan of the head as a precaution.  Patient is now awake alert she is oriented.  States she feels fatigued.  CT scans negative.  Lab work nonrevealing.  Patient discharged home.  Sheila Hill was evaluated in Emergency Department on 12/31/2020 for the symptoms described in the history of present illness. She was evaluated in the context of the global COVID-19 pandemic, which necessitated consideration that the patient might be at risk for infection with the SARS-CoV-2 virus that causes COVID-19. Institutional protocols and algorithms that pertain to the evaluation of patients at risk for COVID-19 are in a state of rapid change based on information released by regulatory bodies including the CDC and federal and state organizations. These policies and algorithms were followed during the patient's care in the ED.  ____________________________________________   FINAL CLINICAL IMPRESSION(S) / ED DIAGNOSES  Seizure   Minna Antis, MD 01/01/21 1312

## 2020-12-31 NOTE — ED Triage Notes (Signed)
Pt arrives via EMS for c/o of seizures, pt has HX of seizures. Pt sates she did hit her head during seizure. Pt is A&Ox4 at this time. Pt states last seizure was a year ago.   Pt denies drugs or alcohol use at this time. Pt states being compliant with anticonvulsants meds.

## 2021-07-24 IMAGING — CT CT HEAD W/O CM
3 series · 16 of 47 positions shown, 19 images · non-contrast
Comparison: 08/21/2020

CLINICAL DATA: Seizure. The patient hit her head during the
seizure. History of seizures.

EXAM:
CT HEAD WITHOUT CONTRAST
TECHNIQUE: Contiguous axial images were obtained from the base of the skull
through the vertex without intravenous contrast.

[Series 2: head wo · axial · 0.41mm/px · z∈[+421,+551]mm · 10 of 32 slices shown, 13 images]
[im 3/32  brain]
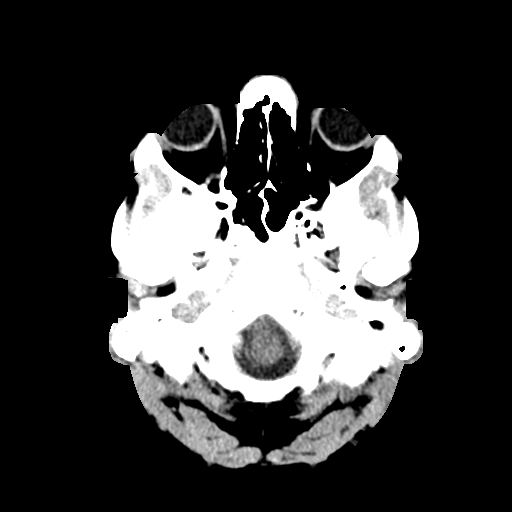
[im 3/32  bone]
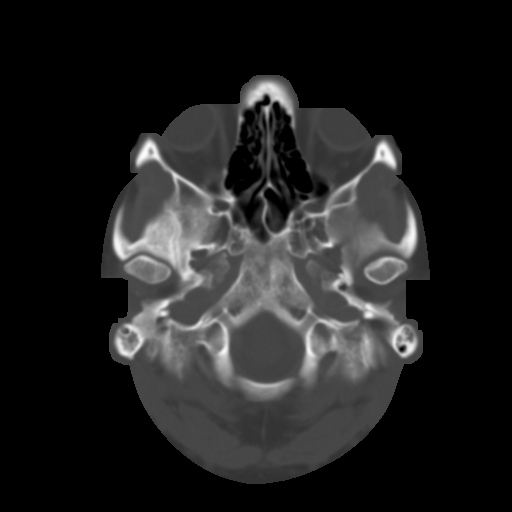
[im 6/32  brain]
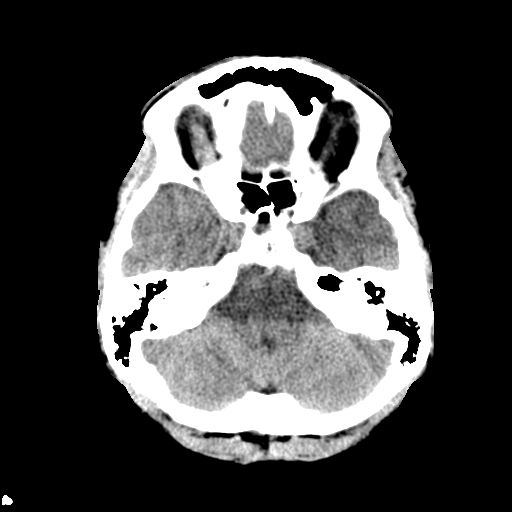
[im 9/32  brain]
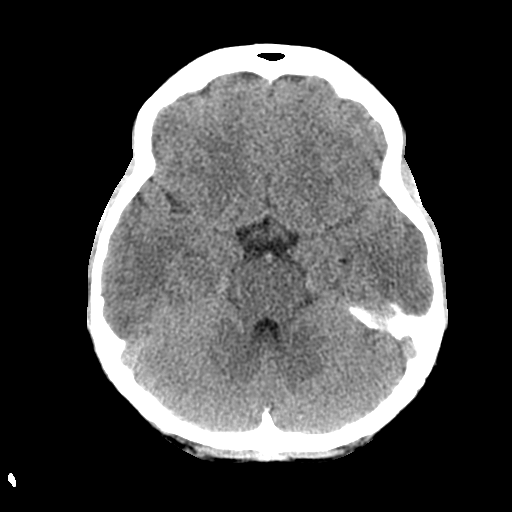
[im 11/32  brain]
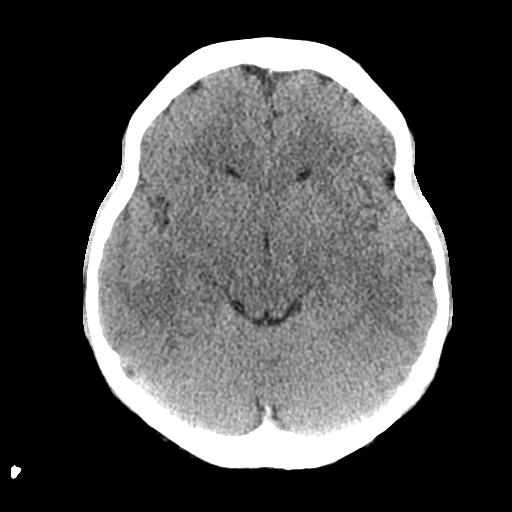
[im 14/32  brain]
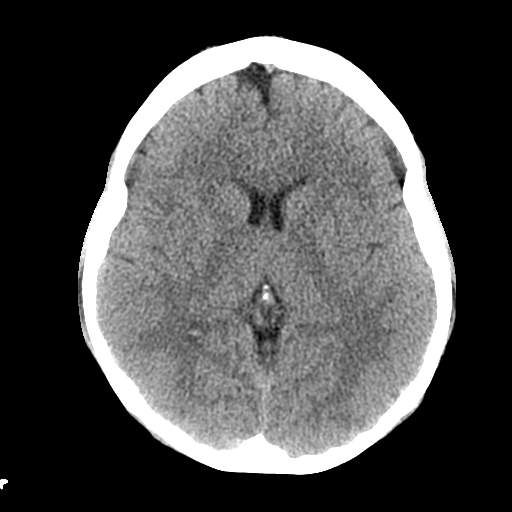
[im 14/32  bone]
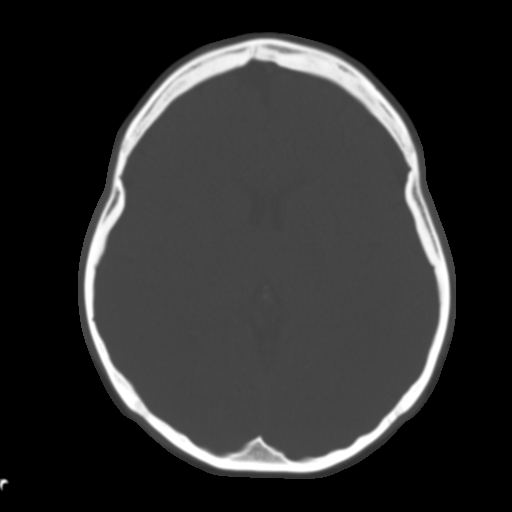
[im 18/32  brain]
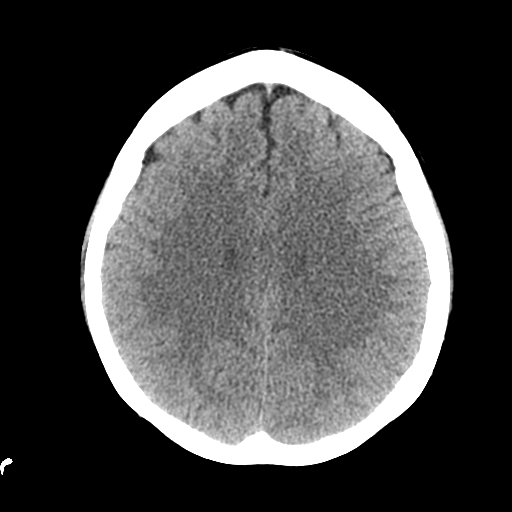
[im 21/32  brain]
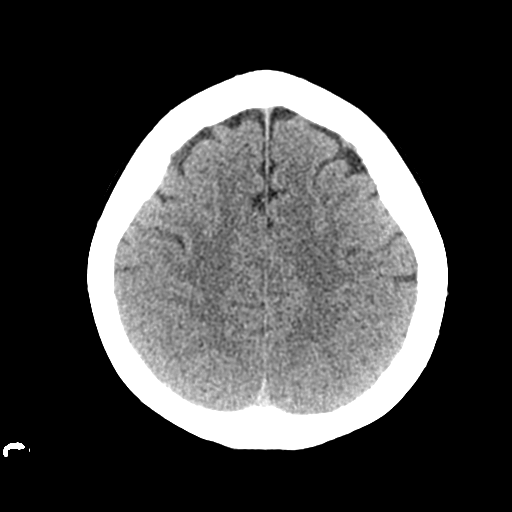
[im 24/32  brain]
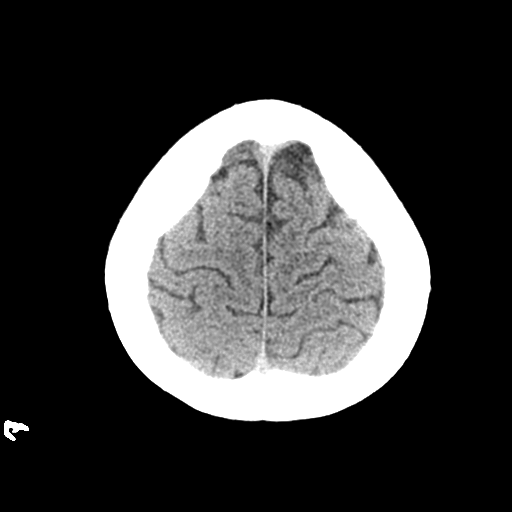
[im 26/32  brain]
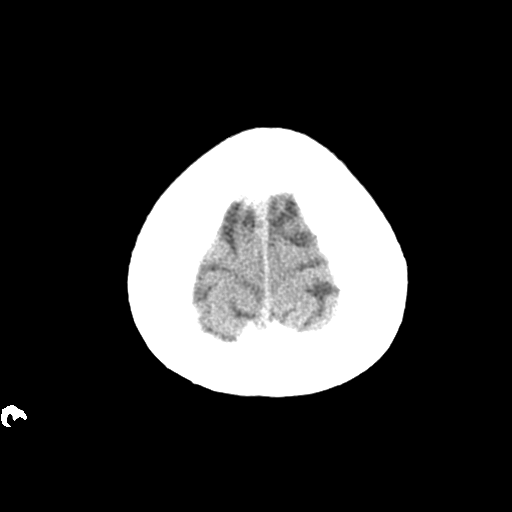
[im 26/32  bone]
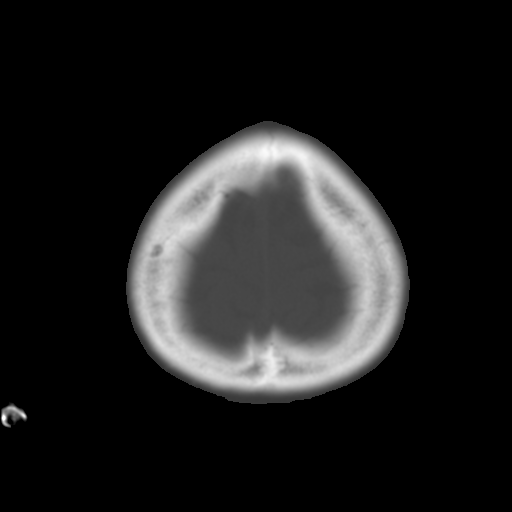
[im 29/32  brain]
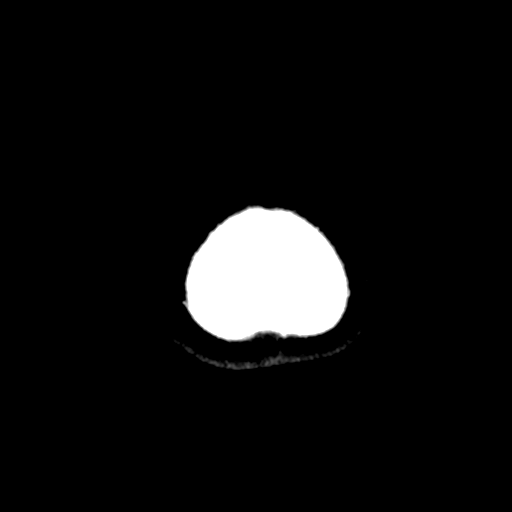

[Series 4: coronal soft tissue · coronal · 0.36mm/px · 3 of 65 slices shown]
[im 22/65  brain]
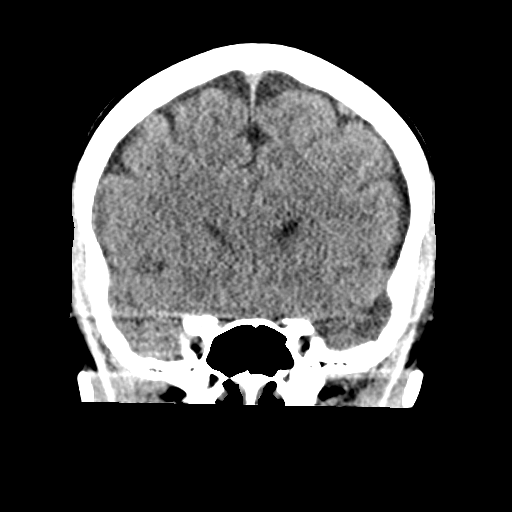
[im 29/65  brain]
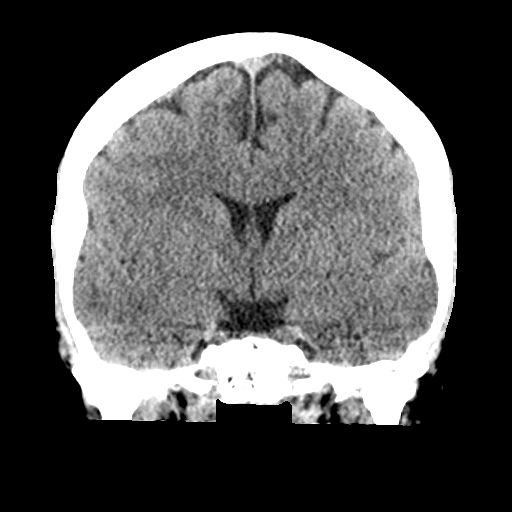
[im 36/65  brain]
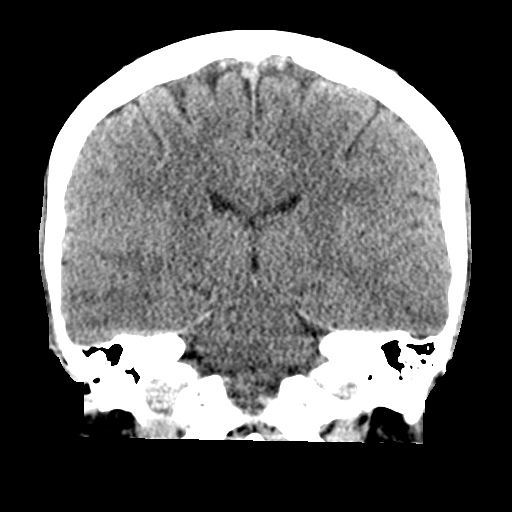

[Series 5: sagittal soft tissue · sagittal · 0.34mm/px · 3 of 61 slices shown]
[im 21/61  brain]
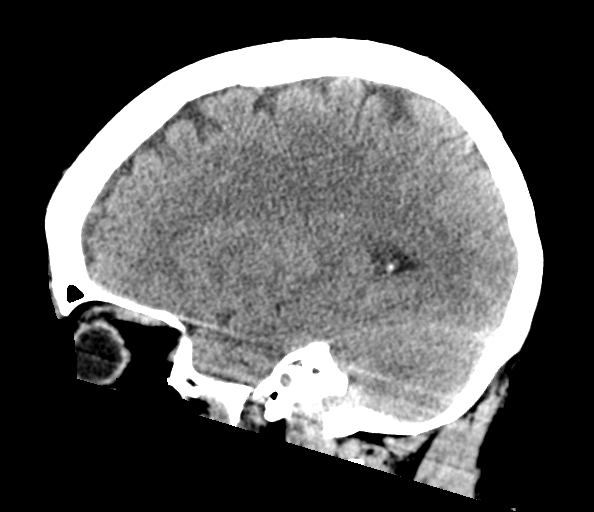
[im 31/61  brain]
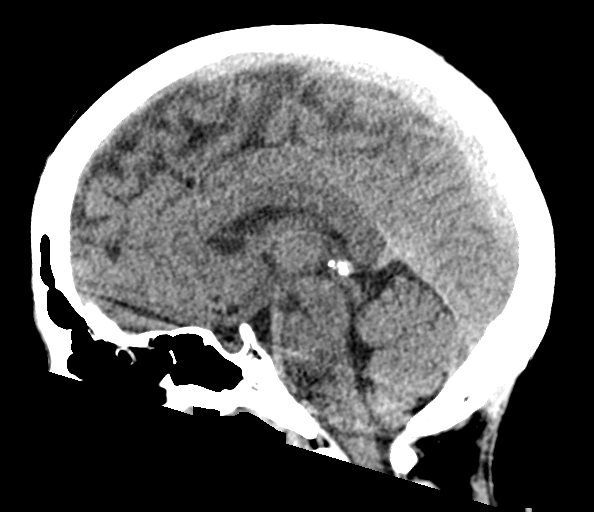
[im 41/61  brain]
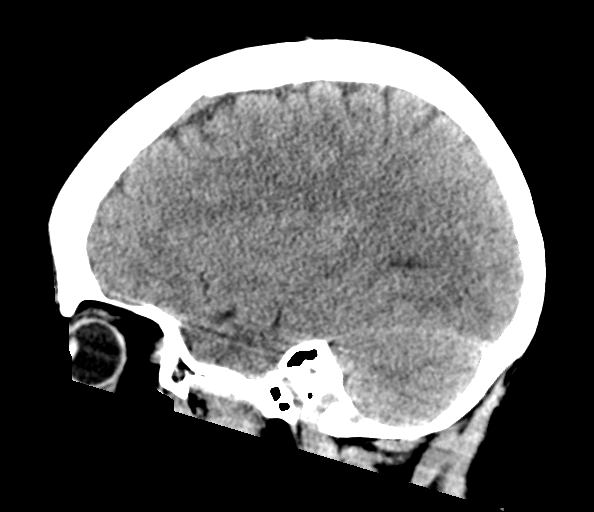

[16 of 47 positions shown; findings below may reference images not displayed]

FINDINGS: Brain: Normal appearing cerebral hemispheres and posterior fossa
structures. Normal size and position of the ventricles. No
intracranial hemorrhage, mass lesion or CT evidence of acute
infarction.

Vascular: No hyperdense vessel or unexpected calcification.

Skull: Normal. Negative for fracture or focal lesion.

Sinuses/Orbits: Unremarkable.

Other: None.
IMPRESSION: Normal examination.

## 2022-03-17 ENCOUNTER — Emergency Department
Admission: EM | Admit: 2022-03-17 | Discharge: 2022-03-18 | Discharge status: Against Medical Advice | Payer: Medicaid Other | Attending: Emergency Medicine | Admitting: Emergency Medicine

## 2022-03-17 ENCOUNTER — Other Ambulatory Visit: Payer: Self-pay

## 2022-03-17 ENCOUNTER — Encounter: Payer: Self-pay | Admitting: Emergency Medicine

## 2022-03-17 DIAGNOSIS — Z5321 Procedure and treatment not carried out due to patient leaving prior to being seen by health care provider: Secondary | ICD-10-CM | POA: Insufficient documentation

## 2022-03-17 DIAGNOSIS — R569 Unspecified convulsions: Secondary | ICD-10-CM | POA: Insufficient documentation

## 2022-03-17 LAB — CBC
HCT: 37.3 % (ref 36.0–46.0)
Hemoglobin: 12.8 g/dL (ref 12.0–15.0)
MCH: 32.5 pg (ref 26.0–34.0)
MCHC: 34.3 g/dL (ref 30.0–36.0)
MCV: 94.7 fL (ref 80.0–100.0)
Platelets: 203 10*3/uL (ref 150–400)
RBC: 3.94 MIL/uL (ref 3.87–5.11)
RDW: 11.2 % — ABNORMAL LOW (ref 11.5–15.5)
WBC: 7.1 10*3/uL (ref 4.0–10.5)
nRBC: 0 % (ref 0.0–0.2)

## 2022-03-17 LAB — VALPROIC ACID LEVEL: Valproic Acid Lvl: <10 ug/mL — ABNORMAL LOW (ref 50.0–100.0)

## 2022-03-17 LAB — BASIC METABOLIC PANEL
Anion gap: 9 (ref 5–15)
BUN: 10 mg/dL (ref 6–20)
CO2: 21 mmol/L — ABNORMAL LOW (ref 22–32)
Calcium: 8.9 mg/dL (ref 8.9–10.3)
Chloride: 108 mmol/L (ref 98–111)
Creatinine, Ser: 0.73 mg/dL (ref 0.44–1.00)
GFR, Estimated: >60 mL/min (ref >60)
Glucose, Bld: 94 mg/dL (ref 70–99)
Potassium: 3.5 mmol/L (ref 3.5–5.1)
Sodium: 138 mmol/L (ref 135–145)

## 2022-03-17 NOTE — ED Triage Notes (Signed)
Pt via EMS from work. Pt had a seizure at work, approx 5 mins long per EMS. Pt has a hx of seizure and takes Depakote and Lamictal. EMS does report post-ictal but on arrival pt is now alert and oriented. Denies missing medication. Last seizure was 2 years ago. Pt is A&OX4 and NAD  120/61 110 HR  97% on RA 99.5 orally  98 CBG

## 2022-03-17 NOTE — ED Triage Notes (Signed)
Pt called from [facility] to treatment room, no reposnse

## 2023-03-31 ENCOUNTER — Emergency Department
Admission: EM | Admit: 2023-03-31 | Discharge: 2023-03-31 | Disposition: A | Discharge status: Home / Self Care | Payer: Medicaid Other | Attending: Emergency Medicine | Admitting: Emergency Medicine

## 2023-03-31 ENCOUNTER — Other Ambulatory Visit: Payer: Self-pay

## 2023-03-31 DIAGNOSIS — R569 Unspecified convulsions: Secondary | ICD-10-CM | POA: Diagnosis present

## 2023-03-31 LAB — CBC
HCT: 39.8 % (ref 36.0–46.0)
Hemoglobin: 13.2 g/dL (ref 12.0–15.0)
MCH: 32.8 pg (ref 26.0–34.0)
MCHC: 33.2 g/dL (ref 30.0–36.0)
MCV: 98.8 fL (ref 80.0–100.0)
Platelets: 202 10*3/uL (ref 150–400)
RBC: 4.03 MIL/uL (ref 3.87–5.11)
RDW: 11.8 % (ref 11.5–15.5)
WBC: 6.4 10*3/uL (ref 4.0–10.5)
nRBC: 0 % (ref 0.0–0.2)

## 2023-03-31 LAB — URINALYSIS, COMPLETE (UACMP) WITH MICROSCOPIC
Bacteria, UA: NONE SEEN
Bilirubin Urine: NEGATIVE
Glucose, UA: NEGATIVE mg/dL
Ketones, ur: NEGATIVE mg/dL
Leukocytes,Ua: NEGATIVE
Nitrite: NEGATIVE
Protein, ur: NEGATIVE mg/dL
Specific Gravity, Urine: 1.013 (ref 1.005–1.030)
pH: 7 (ref 5.0–8.0)

## 2023-03-31 LAB — COMPREHENSIVE METABOLIC PANEL
ALT: 8 U/L (ref 0–44)
AST: 20 U/L (ref 15–41)
Albumin: 3.9 g/dL (ref 3.5–5.0)
Alkaline Phosphatase: 45 U/L (ref 38–126)
Anion gap: 10 (ref 5–15)
BUN: 10 mg/dL (ref 6–20)
CO2: 24 mmol/L (ref 22–32)
Calcium: 8.8 mg/dL — ABNORMAL LOW (ref 8.9–10.3)
Chloride: 104 mmol/L (ref 98–111)
Creatinine, Ser: 0.82 mg/dL (ref 0.44–1.00)
GFR, Estimated: >60 mL/min (ref >60)
Glucose, Bld: 90 mg/dL (ref 70–99)
Potassium: 3.8 mmol/L (ref 3.5–5.1)
Sodium: 138 mmol/L (ref 135–145)
Total Bilirubin: 0.7 mg/dL (ref 0.3–1.2)
Total Protein: 7.5 g/dL (ref 6.5–8.1)

## 2023-03-31 LAB — POC URINE PREG, ED: Preg Test, Ur: NEGATIVE

## 2023-03-31 LAB — VALPROIC ACID LEVEL: Valproic Acid Lvl: 10 ug/mL — ABNORMAL LOW (ref 50.0–100.0)

## 2023-03-31 MED ORDER — SODIUM CHLORIDE 0.9 % IV BOLUS
1000.0000 mL | Freq: Once | INTRAVENOUS | Status: AC
  Administered 2023-03-31: 1000 mL via INTRAVENOUS

## 2023-03-31 NOTE — ED Triage Notes (Signed)
Had a witnessed 30 sec- 1 min seizure, no head injury. Patient has hx and took meds this morning.

## 2023-03-31 NOTE — Discharge Instructions (Addendum)
As we discussed please go ahead and take 300 mg (2 of your 150 mg tablets) of Depakote now.  You may take your normal Depakote dose tonight and continue your normal Depakote dose tomorrow.  Please call your doctor tomorrow to inform them of your low Depakote level today and seizure as this dose may need to be adjusted.  Return to the emergency department for any further seizure activity for any other symptom concerning to yourself.  Do not drive until you have been cleared by neurology.

## 2023-03-31 NOTE — ED Provider Notes (Signed)
[facility] Provider Note    Event Date/Time   First MD Initiated Contact with Patient 03/31/23 1044     (approximate)  History   Chief Complaint: Seizure  HPI  Sheila Hill is a 32 y.o. female with a past medical history of seizure disorder, presents to the emergency department for a witnessed seizure while getting ready to go to work.  According to EMS report patient had a witnessed seizure by mom who was able to help the patient down to the ground, denies any head injury.  Here patient states she feels well just feels tired but denies any pain.  Does have a small abrasion on the left side of her tongue.  Patient has a seizure disorder and believes her last seizure was approximately 6 months ago.  Patient denies missing any doses of her medication.  No recent fever cough congestion nausea vomiting diarrhea.  Physical Exam   Triage Vital Signs: ED Triage Vitals  Encounter Vitals Group     BP      Systolic BP Percentile      Diastolic BP Percentile      Pulse      Resp      Temp      Temp src      SpO2      Weight      Height      Head Circumference      Peak Flow      Pain Score      Pain Loc      Pain Education      Exclude from Growth Chart     Most recent vital signs: There were no vitals filed for this visit.  General: Awake, no distress.  Very small abrasion to the left side of tongue.  No signs of head injury. CV:  Good peripheral perfusion.  Regular rate and rhythm  Resp:  Normal effort.  Equal breath sounds bilaterally.  Abd:  No distention.  Soft, nontender.  No rebound or guarding.  ED Results / Procedures / Treatments   MEDICATIONS ORDERED IN ED: Medications - No data to display   IMPRESSION / MDM / ASSESSMENT AND PLAN / ED COURSE  I reviewed the triage vital signs and the nursing notes.  Patient's presentation is most consistent with acute presentation with potential threat to life or bodily function.  Patient  presents emergency department for a seizure.  Known seizure disorder.  Witnessed tonic-clonic seizure, mom was able to lower the patient down to the floor per report.  Overall the patient appears well she does have a small tongue abrasion but no other signs of injury.  Will obtain a Lamictal and Depakote level as these appear to be the patient's current seizure medications.  Will check basic labs IV hydrate continue to closely monitor.  Patient appears to have returned to baseline her only complaint currently is fatigue.  Patient is awake alert oriented she is ready to go home.  Patient's workup is reassuring with a normal CBC reassuring chemistry normal urinalysis negative pregnancy test.  Patient's valproic acid level did come back low at 10.  I spoke to the patient she states her doctor recently decreased her from 250 Depakote to 150 Depakote twice daily.  Patient has her Depakote with their I informed her to go ahead and take 2 tablets (300 mg) orally now and continue to take her normal dose again tonight.  Patient will follow-up with her doctor tomorrow regarding  her low Depakote level to discuss any medication changes that may be warranted.  I discussed return precautions for any further seizure activity.  FINAL CLINICAL IMPRESSION(S) / ED DIAGNOSES   seizure  Note:  This document was prepared using Dragon voice recognition software and may include unintentional dictation errors.   Minna Antis, MD 03/31/23 1251

## 2023-03-31 NOTE — ED Notes (Signed)
Pt d/c home with visitors per MD order. Discharge summary reviewed, pt verbalizes understanding. Ambulatory . No s/s of acute distress noted at discharge.

## 2023-04-01 LAB — LAMOTRIGINE LEVEL: Lamotrigine Lvl: 6.3 ug/mL (ref 2.0–20.0)

## 2023-04-01 NOTE — Group Note (Deleted)

## 2023-12-10 ENCOUNTER — Ambulatory Visit (LOCAL_COMMUNITY_HEALTH_CENTER): Payer: Self-pay

## 2023-12-10 DIAGNOSIS — Z111 Encounter for screening for respiratory tuberculosis: Secondary | ICD-10-CM

## 2023-12-10 NOTE — Progress Notes (Signed)
 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 ROI signed and patient states she will need paper result copy when ready.  Per Epic, MyChart status is pending.   RN explained that [facility] will contact her when results are ready and she can pick up result copy at [facility] info booth Monday through Friday between hours of 8 am and 5 pm. Questions answered and reports understanding. RN walked patient to lab. Kenta Laster, RN

## 2023-12-12 ENCOUNTER — Other Ambulatory Visit

## 2023-12-14 LAB — QUANTIFERON-TB GOLD PLUS
QuantiFERON Mitogen Value: >10 [IU]/mL
QuantiFERON Nil Value: 0.11 [IU]/mL
QuantiFERON TB1 Ag Value: 0.11 [IU]/mL
QuantiFERON TB2 Ag Value: 0.1 [IU]/mL
QuantiFERON-TB Gold Plus: NEGATIVE

## 2023-12-17 ENCOUNTER — Ambulatory Visit: Payer: Self-pay

## 2023-12-17 NOTE — Progress Notes (Signed)
 Reviewed. QFT results negative.  Phone call to patient and no answer (8:50 am).  Message stating voice mail not set up. Per Epic, appears MyChart not set up and therefore patient has not seen results. Will attempt phone call at later time. Daymion Nazaire, RN  12:40 pm and 4:50 pm. Phone call to patient, no answer with message stating voice mail not set up. Rahul Malinak, RN

## 2023-12-17 NOTE — Progress Notes (Signed)
 Patient presented to [facility] for results pickup. Notified patient of negative results and provided 2 copies of results to patient. All questions answered and verbalizes understanding. Clare Critchley, RN
